# Patient Record
Sex: Male | Born: 1935 | Hispanic: No | Smoking: Former smoker
Health system: Southern US, Community
[De-identification: ages and names within clinical notes are randomized; demographics above are authoritative.]

## PROBLEM LIST (undated history)

## (undated) DIAGNOSIS — K5900 Constipation, unspecified: Secondary | ICD-10-CM

## (undated) DIAGNOSIS — L309 Dermatitis, unspecified: Secondary | ICD-10-CM

## (undated) DIAGNOSIS — N400 Enlarged prostate without lower urinary tract symptoms: Secondary | ICD-10-CM

## (undated) DIAGNOSIS — Z7982 Long term (current) use of aspirin: Secondary | ICD-10-CM

## (undated) DIAGNOSIS — N189 Chronic kidney disease, unspecified: Secondary | ICD-10-CM

## (undated) DIAGNOSIS — M720 Palmar fascial fibromatosis [Dupuytren]: Secondary | ICD-10-CM

## (undated) DIAGNOSIS — I951 Orthostatic hypotension: Secondary | ICD-10-CM

## (undated) DIAGNOSIS — M810 Age-related osteoporosis without current pathological fracture: Secondary | ICD-10-CM

## (undated) DIAGNOSIS — R29898 Other symptoms and signs involving the musculoskeletal system: Secondary | ICD-10-CM

## (undated) DIAGNOSIS — L570 Actinic keratosis: Secondary | ICD-10-CM

## (undated) DIAGNOSIS — R42 Dizziness and giddiness: Secondary | ICD-10-CM

## (undated) DIAGNOSIS — K219 Gastro-esophageal reflux disease without esophagitis: Secondary | ICD-10-CM

## (undated) DIAGNOSIS — Z8719 Personal history of other diseases of the digestive system: Secondary | ICD-10-CM

## (undated) DIAGNOSIS — R269 Unspecified abnormalities of gait and mobility: Secondary | ICD-10-CM

## (undated) DIAGNOSIS — G4700 Insomnia, unspecified: Secondary | ICD-10-CM

## (undated) DIAGNOSIS — R351 Nocturia: Secondary | ICD-10-CM

## (undated) DIAGNOSIS — F419 Anxiety disorder, unspecified: Secondary | ICD-10-CM

## (undated) DIAGNOSIS — K649 Unspecified hemorrhoids: Secondary | ICD-10-CM

## (undated) DIAGNOSIS — N529 Male erectile dysfunction, unspecified: Secondary | ICD-10-CM

## (undated) DIAGNOSIS — H9319 Tinnitus, unspecified ear: Secondary | ICD-10-CM

## (undated) DIAGNOSIS — R600 Localized edema: Secondary | ICD-10-CM

## (undated) HISTORY — PX: EYE SURGERY: SHX253

## (undated) HISTORY — PX: MOUTH SURGERY: SHX715

---

## 1971-08-31 HISTORY — PX: CHOLECYSTECTOMY: SHX55

## 2006-12-02 ENCOUNTER — Encounter: Admission: RE | Admit: 2006-12-02 | Discharge: 2006-12-28 | Payer: Self-pay | Admitting: Neurology

## 2010-12-16 ENCOUNTER — Ambulatory Visit
Admission: RE | Admit: 2010-12-16 | Discharge: 2010-12-16 | Disposition: A | Discharge status: Home / Self Care | Payer: Medicare Other | Source: Ambulatory Visit | Attending: Internal Medicine | Admitting: Internal Medicine

## 2010-12-16 ENCOUNTER — Other Ambulatory Visit: Payer: Self-pay | Admitting: Internal Medicine

## 2010-12-16 DIAGNOSIS — R609 Edema, unspecified: Secondary | ICD-10-CM

## 2012-11-24 IMAGING — US US EXTREM LOW VENOUS*L*
1 series · 14 of 24 positions shown · non-contrast
Comparison: None.

CLINICAL DATA: Pain and swelling left leg.  Rule out DVT

LEFT LOWER EXTREMITY VENOUS DUPLEX ULTRASOUND
TECHNIQUE: Gray-scale sonography with graded compression, as well
as color Doppler and duplex ultrasound were performed to evaluate
the deep venous system of the lower extremity from the level of the
common femoral vein through the popliteal and proximal calf veins.
Spectral Doppler was utilized to evaluate flow at rest and with
distal augmentation maneuvers.

[Series 1: us extrem low venous*left* · 14 of 32 slices shown]
[im 1/32]
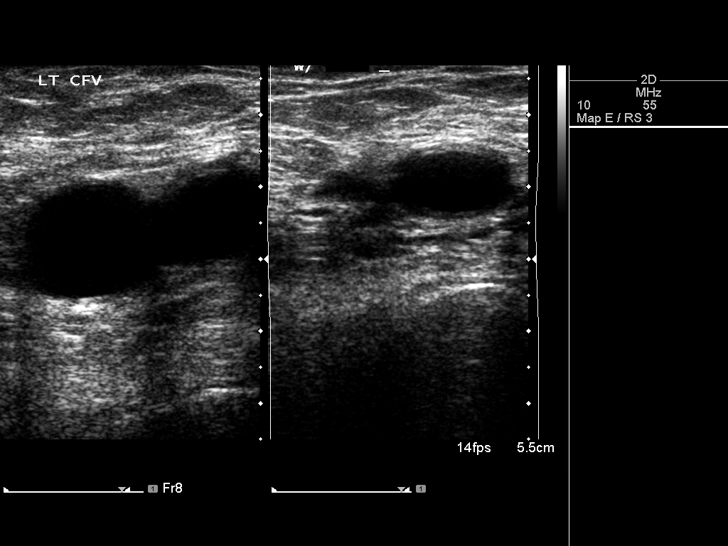
[im 3/32]
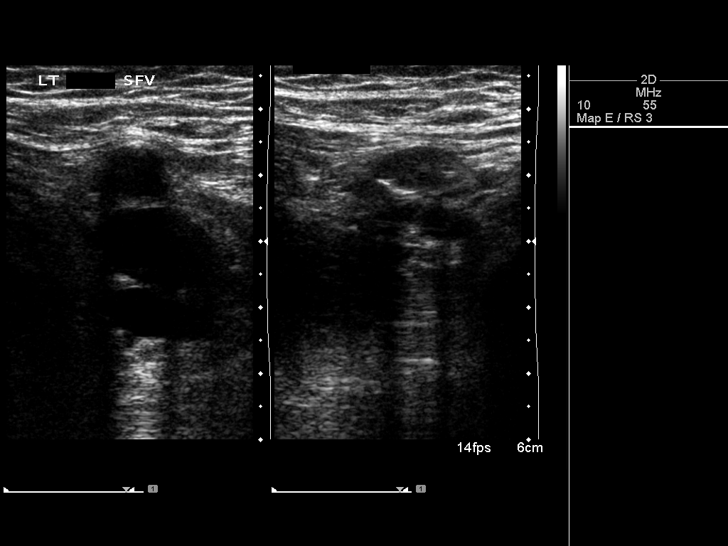
[im 6/32]
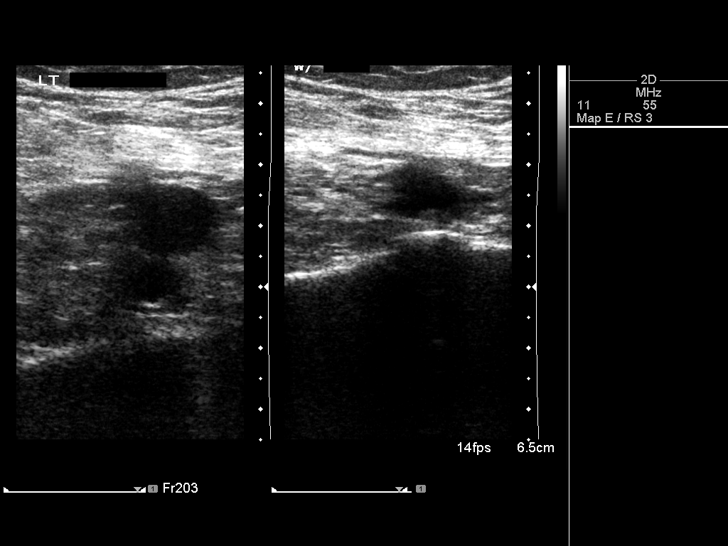
[im 9/32]
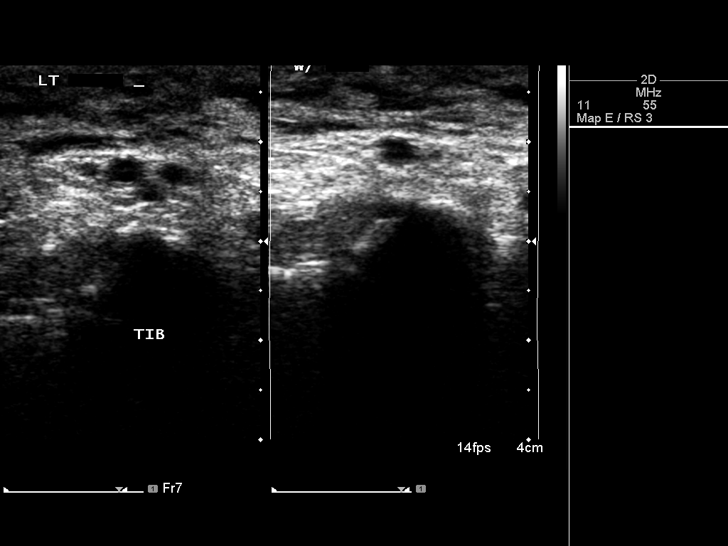
[im 10/32]
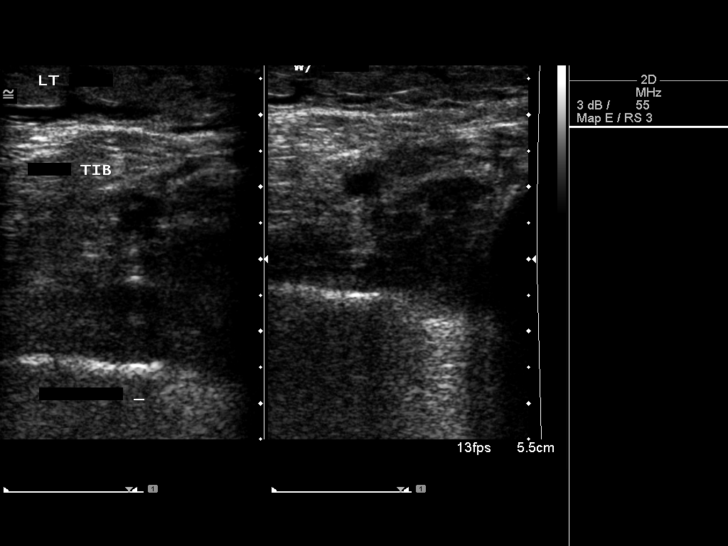
[im 13/32]
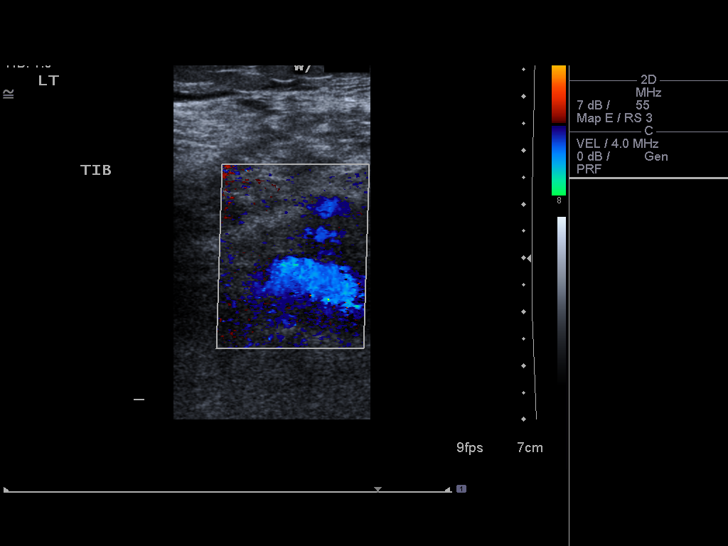
[im 15/32]
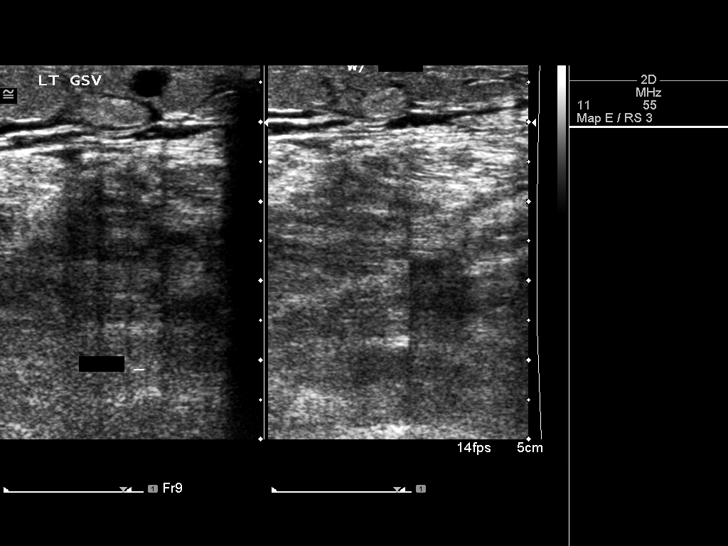
[im 17/32]
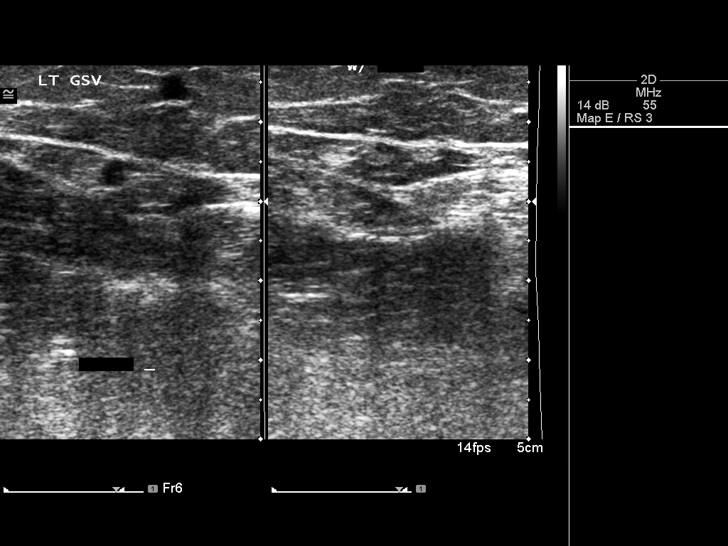
[im 19/32]
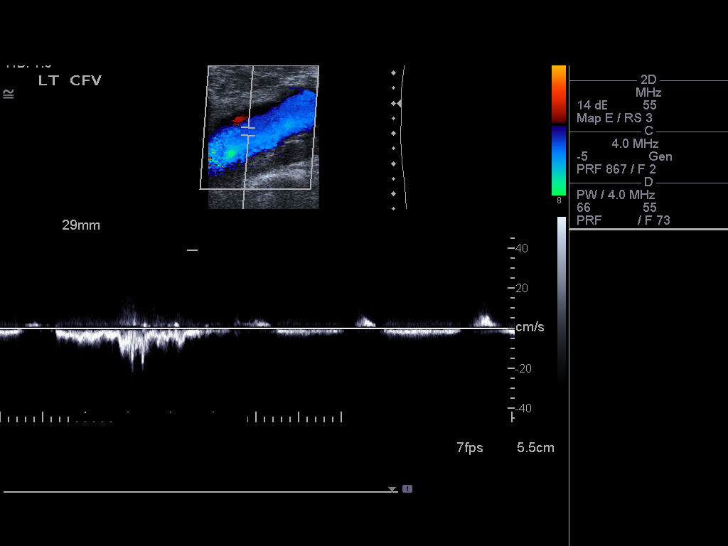
[im 22/32]
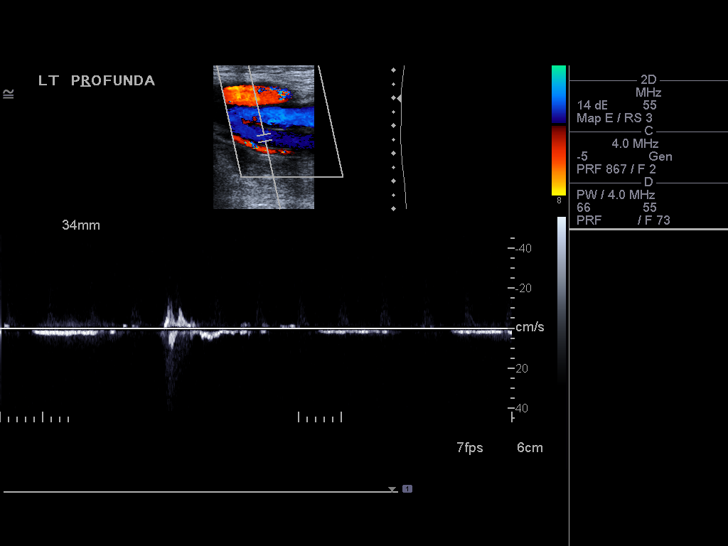
[im 25/32]
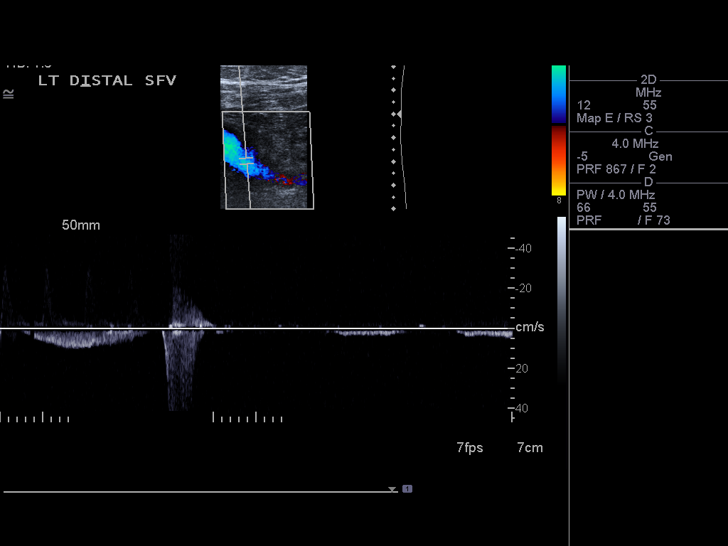
[im 26/32]
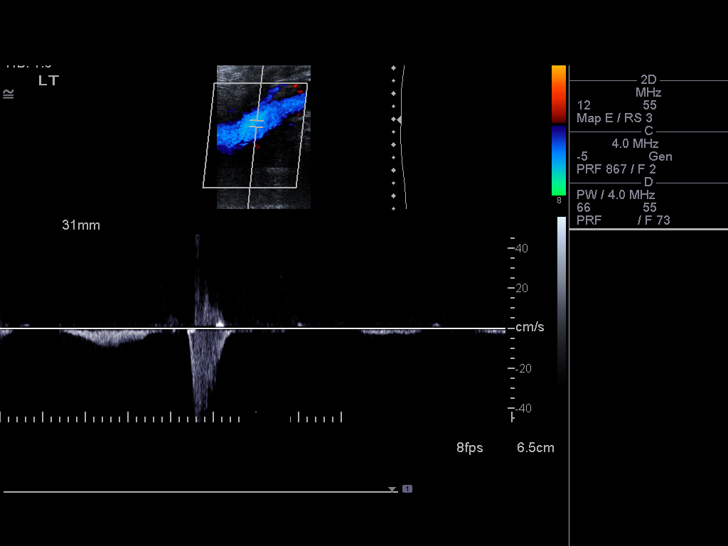
[im 29/32]
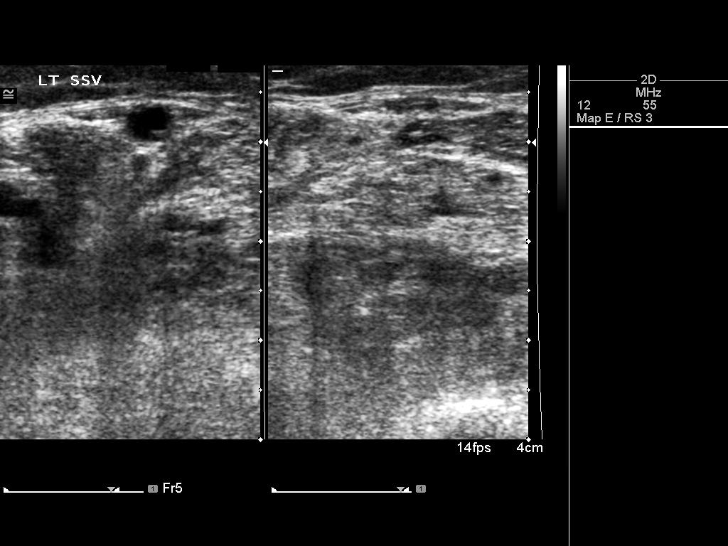
[im 32/32]
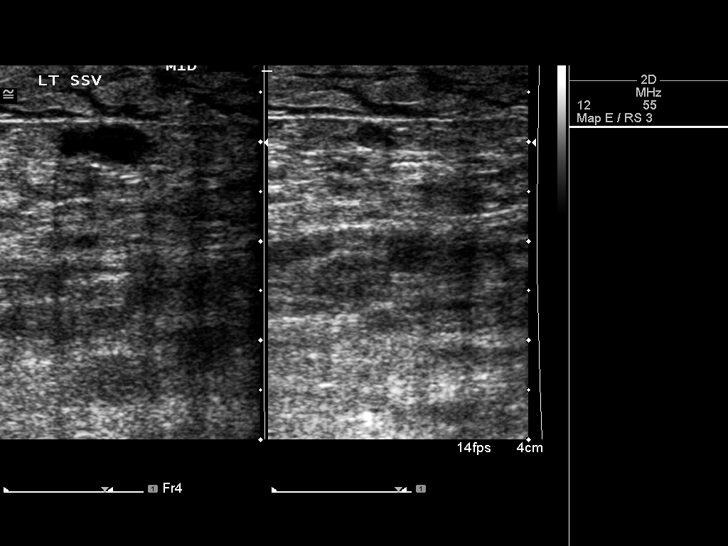

[14 of 24 positions shown; findings below may reference images not displayed]

FINDINGS: Normal compressibility of the common femoral,
superficial femoral, and popliteal veins is demonstrated, as well
as the visualized proximal calf veins.  No filling defects to
suggest DVT on grayscale or color Doppler imaging.  Doppler
waveforms show normal direction of venous flow, normal respiratory
phasicity and response to augmentation.
IMPRESSION: No evidence of lower extremity deep vein thrombosis.

## 2014-11-26 ENCOUNTER — Inpatient Hospital Stay (HOSPITAL_COMMUNITY)
Admission: EM | Admit: 2014-11-26 | Discharge: 2014-12-29 | DRG: 377 | Disposition: E | Payer: Medicare Other | Attending: Internal Medicine | Admitting: Internal Medicine

## 2014-11-26 ENCOUNTER — Encounter (HOSPITAL_COMMUNITY): Payer: Self-pay | Admitting: Emergency Medicine

## 2014-11-26 DIAGNOSIS — Z66 Do not resuscitate: Secondary | ICD-10-CM | POA: Diagnosis present

## 2014-11-26 DIAGNOSIS — I951 Orthostatic hypotension: Secondary | ICD-10-CM | POA: Diagnosis present

## 2014-11-26 DIAGNOSIS — J189 Pneumonia, unspecified organism: Secondary | ICD-10-CM | POA: Diagnosis not present

## 2014-11-26 DIAGNOSIS — K921 Melena: Secondary | ICD-10-CM | POA: Diagnosis present

## 2014-11-26 DIAGNOSIS — T39395A Adverse effect of other nonsteroidal anti-inflammatory drugs [NSAID], initial encounter: Secondary | ICD-10-CM | POA: Diagnosis present

## 2014-11-26 DIAGNOSIS — A419 Sepsis, unspecified organism: Secondary | ICD-10-CM | POA: Diagnosis not present

## 2014-11-26 DIAGNOSIS — Z515 Encounter for palliative care: Secondary | ICD-10-CM | POA: Diagnosis not present

## 2014-11-26 DIAGNOSIS — K922 Gastrointestinal hemorrhage, unspecified: Secondary | ICD-10-CM

## 2014-11-26 DIAGNOSIS — J181 Lobar pneumonia, unspecified organism: Secondary | ICD-10-CM | POA: Diagnosis not present

## 2014-11-26 DIAGNOSIS — Z79899 Other long term (current) drug therapy: Secondary | ICD-10-CM

## 2014-11-26 DIAGNOSIS — D696 Thrombocytopenia, unspecified: Secondary | ICD-10-CM | POA: Diagnosis present

## 2014-11-26 DIAGNOSIS — D638 Anemia in other chronic diseases classified elsewhere: Secondary | ICD-10-CM

## 2014-11-26 DIAGNOSIS — D62 Acute posthemorrhagic anemia: Secondary | ICD-10-CM | POA: Diagnosis present

## 2014-11-26 DIAGNOSIS — M1990 Unspecified osteoarthritis, unspecified site: Secondary | ICD-10-CM | POA: Diagnosis present

## 2014-11-26 DIAGNOSIS — J988 Other specified respiratory disorders: Secondary | ICD-10-CM

## 2014-11-26 HISTORY — DX: Male erectile dysfunction, unspecified: N52.9

## 2014-11-26 HISTORY — DX: Unspecified abnormalities of gait and mobility: R26.9

## 2014-11-26 HISTORY — DX: Long term (current) use of aspirin: Z79.82

## 2014-11-26 HISTORY — DX: Chronic kidney disease, unspecified: N18.9

## 2014-11-26 HISTORY — DX: Other symptoms and signs involving the musculoskeletal system: R29.898

## 2014-11-26 HISTORY — DX: Unspecified hemorrhoids: K64.9

## 2014-11-26 HISTORY — DX: Nocturia: R35.1

## 2014-11-26 HISTORY — DX: Tinnitus, unspecified ear: H93.19

## 2014-11-26 HISTORY — DX: Palmar fascial fibromatosis (dupuytren): M72.0

## 2014-11-26 HISTORY — DX: Benign prostatic hyperplasia without lower urinary tract symptoms: N40.0

## 2014-11-26 HISTORY — DX: Dizziness and giddiness: R42

## 2014-11-26 HISTORY — DX: Age-related osteoporosis without current pathological fracture: M81.0

## 2014-11-26 HISTORY — DX: Insomnia, unspecified: G47.00

## 2014-11-26 HISTORY — DX: Personal history of other diseases of the digestive system: Z87.19

## 2014-11-26 HISTORY — DX: Anxiety disorder, unspecified: F41.9

## 2014-11-26 HISTORY — DX: Gastro-esophageal reflux disease without esophagitis: K21.9

## 2014-11-26 HISTORY — DX: Constipation, unspecified: K59.00

## 2014-11-26 HISTORY — DX: Dermatitis, unspecified: L30.9

## 2014-11-26 HISTORY — DX: Orthostatic hypotension: I95.1

## 2014-11-26 HISTORY — DX: Localized edema: R60.0

## 2014-11-26 HISTORY — DX: Actinic keratosis: L57.0

## 2014-11-26 LAB — BASIC METABOLIC PANEL
ANION GAP: 10 (ref 5–15)
BUN: 61 mg/dL — ABNORMAL HIGH (ref 6–23)
CALCIUM: 8.9 mg/dL (ref 8.4–10.5)
CHLORIDE: 106 mmol/L (ref 96–112)
CO2: 22 mmol/L (ref 19–32)
CREATININE: 1.22 mg/dL (ref 0.50–1.35)
GFR, EST AFRICAN AMERICAN: 54 mL/min — AB (ref >90)
GFR, EST NON AFRICAN AMERICAN: 46 mL/min — AB (ref >90)
GLUCOSE: 95 mg/dL (ref 70–99)
POTASSIUM: 4.8 mmol/L (ref 3.5–5.1)
SODIUM: 138 mmol/L (ref 135–145)

## 2014-11-26 LAB — CBC WITH DIFFERENTIAL/PLATELET
BASOS ABS: 0 10*3/uL (ref 0.0–0.1)
Basophils Relative: 0 % (ref 0–1)
Eosinophils Absolute: 0 10*3/uL (ref 0.0–0.7)
Eosinophils Relative: 0 % (ref 0–5)
HEMATOCRIT: 29.8 % — AB (ref 39.0–52.0)
HEMOGLOBIN: 9.7 g/dL — AB (ref 13.0–17.0)
LYMPHS ABS: 1.2 10*3/uL (ref 0.7–4.0)
Lymphocytes Relative: 15 % (ref 12–46)
MCH: 31 pg (ref 26.0–34.0)
MCHC: 32.6 g/dL (ref 30.0–36.0)
MCV: 95.2 fL (ref 78.0–100.0)
MONOS PCT: 6 % (ref 3–12)
Monocytes Absolute: 0.5 10*3/uL (ref 0.1–1.0)
NEUTROS ABS: 6.2 10*3/uL (ref 1.7–7.7)
Neutrophils Relative %: 79 % — ABNORMAL HIGH (ref 43–77)
PLATELETS: 89 10*3/uL — AB (ref 150–400)
RBC: 3.13 MIL/uL — ABNORMAL LOW (ref 4.22–5.81)
RDW: 14.7 % (ref 11.5–15.5)
WBC: 7.8 10*3/uL (ref 4.0–10.5)

## 2014-11-26 LAB — ABO/RH: ABO/RH(D): A NEG

## 2014-11-26 LAB — CBC
HCT: 29.2 % — ABNORMAL LOW (ref 39.0–52.0)
Hemoglobin: 9.5 g/dL — ABNORMAL LOW (ref 13.0–17.0)
MCH: 30.7 pg (ref 26.0–34.0)
MCHC: 32.5 g/dL (ref 30.0–36.0)
MCV: 94.5 fL (ref 78.0–100.0)
Platelets: 136 10*3/uL — ABNORMAL LOW (ref 150–400)
RBC: 3.09 MIL/uL — AB (ref 4.22–5.81)
RDW: 14.5 % (ref 11.5–15.5)
WBC: 6.8 10*3/uL (ref 4.0–10.5)

## 2014-11-26 LAB — POC OCCULT BLOOD, ED: Fecal Occult Bld: POSITIVE — AB

## 2014-11-26 MED ORDER — ONDANSETRON HCL 4 MG PO TABS: 4.0000 mg | ORAL_TABLET | Freq: Four times a day (QID) | ORAL | Status: DC | PRN

## 2014-11-26 MED ORDER — ACETAMINOPHEN 650 MG RE SUPP
650.0000 mg | Freq: Four times a day (QID) | RECTAL | Status: DC | PRN
  Administered 2014-11-29: 650 mg via RECTAL
  Filled 2014-11-26: qty 1

## 2014-11-26 MED ORDER — ACETAMINOPHEN 325 MG PO TABS
650.0000 mg | ORAL_TABLET | Freq: Four times a day (QID) | ORAL | Status: DC | PRN
  Administered 2014-11-27: 975 mg via ORAL
  Administered 2014-11-27: 650 mg via ORAL
  Administered 2014-11-28: 975 mg via ORAL
  Filled 2014-11-26: qty 2
  Filled 2014-11-26 (×2): qty 3

## 2014-11-26 MED ORDER — SODIUM CHLORIDE 0.9 % IV SOLN: 250.0000 mL | INTRAVENOUS | Status: DC | PRN

## 2014-11-26 MED ORDER — MIRTAZAPINE 15 MG PO TABS
15.0000 mg | ORAL_TABLET | Freq: Every evening | ORAL | Status: DC | PRN
  Administered 2014-11-26 – 2014-11-29 (×3): 15 mg via ORAL
  Filled 2014-11-26 (×6): qty 1

## 2014-11-26 MED ORDER — SODIUM CHLORIDE 0.9 % IJ SOLN
3.0000 mL | Freq: Two times a day (BID) | INTRAMUSCULAR | Status: DC
  Administered 2014-11-26: 3 mL via INTRAVENOUS

## 2014-11-26 MED ORDER — SODIUM CHLORIDE 0.9 % IV SOLN
INTRAVENOUS | Status: DC
  Administered 2014-11-26 – 2014-11-28 (×4): via INTRAVENOUS

## 2014-11-26 MED ORDER — PANTOPRAZOLE SODIUM 40 MG IV SOLR
40.0000 mg | Freq: Once | INTRAVENOUS | Status: AC
  Administered 2014-11-26: 40 mg via INTRAVENOUS
  Filled 2014-11-26: qty 40

## 2014-11-26 MED ORDER — ALUM & MAG HYDROXIDE-SIMETH 200-200-20 MG/5ML PO SUSP: 30.0000 mL | Freq: Four times a day (QID) | ORAL | Status: DC | PRN

## 2014-11-26 MED ORDER — SODIUM CHLORIDE 0.9 % IV BOLUS (SEPSIS)
1000.0000 mL | Freq: Once | INTRAVENOUS | Status: AC
  Administered 2014-11-26: 1000 mL via INTRAVENOUS

## 2014-11-26 MED ORDER — ONDANSETRON HCL 4 MG/2ML IJ SOLN: 4.0000 mg | Freq: Four times a day (QID) | INTRAMUSCULAR | Status: DC | PRN

## 2014-11-26 MED ORDER — SODIUM CHLORIDE 0.9 % IJ SOLN: 3.0000 mL | INTRAMUSCULAR | Status: DC | PRN

## 2014-11-26 NOTE — Progress Notes (Signed)
pcp is Radio producerwalter pharr EPIC updated

## 2014-11-26 NOTE — ED Provider Notes (Signed)
CSN: [id number]     Arrival date & time 11/23/2014  1156 History   First MD Initiated Contact with Patient 11/10/2014 1259     Chief Complaint  Patient presents with  . Rectal Bleeding    Patient is a [age] y.o. male presenting with hematochezia. The history is provided by the patient, a caregiver and a relative.  Rectal Bleeding Quality:  Black and tarry Amount:  Moderate Duration:  3 days Timing:  Intermittent Progression:  Worsening Chronicity:  New Relieved by:  Nothing Worsened by:  Nothing tried Associated symptoms: no abdominal pain, no fever and no loss of consciousness   Risk factors: no anticoagulant use   pt reported dark stool about 3 days ago to family He has been taking ibuprofen recently He reports he feels weak He denies any abdominal pain No other complaints at this time   Past Medical History  Diagnosis Date  . Anxiety    No past surgical history on file. No family history on file. History  Substance Use Topics  . Smoking status: Never Smoker   . Smokeless tobacco: Not on file  . Alcohol Use: No    Review of Systems  Constitutional: Negative for fever.  Cardiovascular: Negative for chest pain.  Gastrointestinal: Positive for hematochezia. Negative for abdominal pain.  Neurological: Positive for weakness. Negative for loss of consciousness.  All other systems reviewed and are negative.     Allergies  Review of patient's allergies indicates no known allergies.  Home Medications   Prior to Admission medications   Medication Sig Start Date End Date Taking? Authorizing Provider  mirtazapine (REMERON) 15 MG tablet Take 15 mg by mouth every evening.   Yes Historical Provider, MD  Suvorexant 20 MG TABS Take 0.5-1 tablets by mouth daily as needed (for sleep).    Yes Historical Provider, MD   BP 138/73 mmHg  Pulse 84  Temp(Src) 97.6 F (36.4 C) (Oral)  Resp 15  SpO2 98% Physical Exam CONSTITUTIONAL: elderly, fraily HEAD:  Normocephalic/atraumatic EYES: EOMI ENMT: Mucous membranes moist, poor dentition NECK: supple no meningeal signs SPINE/BACK:entire spine nontender CV: S1/S2 noted, no murmurs/rubs/gallops noted LUNGS: Lungs are clear to auscultation bilaterally, no apparent distress ABDOMEN: soft, nontender, no rebound or guarding, bowel sounds noted throughout abdomen Rectal - black stool noted.  Hemoccult positive. Nurse chaperone at bedside NEURO: Pt is awake/alert/appropriate, moves all extremitiesx4.   EXTREMITIES: pulses normal/equal, full ROM SKIN: warm, color normal   ED Course  Procedures   3:21 PM Pt with GI bleed He is stable at this time Per daughters who are POA - pt is DNR but would accept blood products if necessary D/w dr Butler Denmarkrizwan will admit.  Will await orthostatics to determine if patient requires stepdown Labs Review Labs Reviewed  BASIC METABOLIC PANEL - Abnormal; Notable for the following:    BUN 61 (*)    GFR calc non Af Amer 46 (*)    GFR calc Af Amer 54 (*)    All other components within normal limits  CBC WITH DIFFERENTIAL/PLATELET - Abnormal; Notable for the following:    RBC 3.13 (*)    Hemoglobin 9.7 (*)    HCT 29.8 (*)    Platelets 89 (*)    Neutrophils Relative % 79 (*)    All other components within normal limits  POC OCCULT BLOOD, ED - Abnormal; Notable for the following:    Fecal Occult Bld POSITIVE (*)    All other components within normal limits  TYPE AND  SCREEN  ABO/RH     MDM   Final diagnoses:  Gastrointestinal hemorrhage, unspecified gastritis, unspecified gastrointestinal hemorrhage type    Nursing notes including past medical history and social history reviewed and considered in documentation Labs/vital reviewed myself and considered during evaluation     Zadie Rhine, MD 11/15/2014 [phone]

## 2014-11-26 NOTE — Plan of Care (Signed)
This NP called by Dr. Toniann FailKakrakandy of [facility]. Pt originally slated for SDU bed. However, there are no SDU beds available and pt is a DNR and family wishes no procedures or aggressive measures. Therefore, per Dr. Toniann FailKakrakandy, pt may be admitted to tele bed. Order placed. Informed ED.  Darren NormanKaren Kirby-Graham, NP [facility]

## 2014-11-26 NOTE — Progress Notes (Addendum)
81 [place] living with Daughter, Andrey CampanileSandy c/o black stools dx melena, GI bleed.   CM consult family interested in home health ED CM spoke with pt and 2 daughters (Peggy & Andrey CampanileSandy- retired Engineer, maintenanceschool teachers, Clinical cytogeneticisteggy [place]  Report previous attempt with home health [facility] Now has home instead staff "Nate" working with pt only CM reviewed in details medicare guidelines, home health (HH) (length of stay in home, [facility], coverage, primary caregiver, up to 24 hrs before services may be started), Private duty nursing (PDN-coverage, length of stay in the home types of staff available), assisted living (ASL- coverage, services offered) and Skilled nursing facilities (snf- coverage and services offered)  CM reviewed availability of HH SW to assist pcp to get pt to snf (if desired disposition) from the community level. CM provided both daughters with a list of [place] home health agencies & PDN. Answered all their questions. Daughters are still un decided if services will be needed at discharge Informed them CM would enter orders and if needed they can access or later if pt d/c his pcp can attempt to start services again Pt and daughters voiced understanding, appreciate the offer and agreed to offer of initiating home health orders.

## 2014-11-26 NOTE — ED Notes (Signed)
I tried to get blood but was unsuccessful 

## 2014-11-26 NOTE — H&P (Addendum)
[facility] History and Physical  Darren Pollard YQM:[id number] DOB: [birth year] DOA: 2014/12/08   PCP: Londell Moh, MD    Chief Complaint: black stools  HPI: Darren Pollard is a 81 y.o. male with arthritis and insomnia who presents with black stools for 3 days. He had 2 episodes this morning he has not been lightheaded and has not had any abdominal pain. He did not have any nausea or vomiting or weight loss. He has been taking ibuprofen just started in February for his arthritis. He's been taking it once or twice a day. And at times he's been taking aspirin. He is not taking Kaopectate.  He's found to be Hemoccult positive. He does not want an EGD if bleeding was to become severe. His daughters are at his bedside and they are agreement with this.   General: The patient denies anorexia, fever, weight loss- he has had generalized weakness for the past couple of days and had a fall 2 days ago according to his daughter. He is otherwise not prone to falling. Cardiac: Denies chest pain, syncope, palpitations, pedal edema  Respiratory: Denies cough, shortness of breath, wheezing GI: Denies severe indigestion/heartburn, abdominal pain, nausea, vomiting, diarrhea and constipation GU: Denies hematuria, incontinence, dysuria  Musculoskeletal: Other than arthritis pain, and no other severe pain Skin: Denies suspicious skin lesions Neurologic: Denies focal weakness or numbness, change in vision Psychiatry: Denies depression or anxiety. Has insomnia Hematologic: +easy bruising or bleeding   Past Medical History  Diagnosis Date  . Anxiety     No past surgical history on file.  Social History: does not smoke or drink alcohol Lives at home with daughter- walks with a cane    No Known Allergies  Family history - no medical issues in his family   Prior to Admission medications   Medication Sig Start Date End Date Taking? Authorizing Provider  mirtazapine (REMERON) 15 MG tablet  Take 15 mg by mouth every evening.   Yes Historical Provider, MD  Suvorexant 20 MG TABS Take 0.5-1 tablets by mouth daily as needed (for sleep).    Yes Historical Provider, MD     Physical Exam: Filed Vitals:   12/08/14 1430 12/08/2014 1445 12/08/2014 1451 12-08-14 1453  BP: 136/82 147/79 138/73   Pulse: 86 86 87 84  Temp:      TempSrc:      Resp: SpO2: 98% 98%  98%     General: Awake alert oriented 3, very hard of hearing HEENT: Normocephalic and Atraumatic, Mucous membranes pink                PERRLA; EOM intact; No scleral icterus,                 Nares: Patent, Oropharynx: Clear, Fair Dentition                 Neck: FROM, no cervical lymphadenopathy, thyromegaly, carotid bruit or JVD;  Breasts: deferred CHEST WALL: No tenderness  CHEST: Normal respiration, clear to auscultation bilaterally  HEART: Regular rate and rhythm; no murmurs rubs or gallops  BACK: No kyphosis or scoliosis; no CVA tenderness  GI: Positive Bowel Sounds, soft, non-tender; no masses, no organomegaly Rectal Exam: deferred MSK: No cyanosis, clubbing, + pitting edema in bilateral legs Genitalia: not examined  SKIN:  no rash or ulceration  CNS: Alert and Oriented x 4, Nonfocal exam, CN 2-12 intact  Labs on Admission:  Basic Metabolic Panel:  Recent Labs Lab 12-08-14 1327  NA 138  K 4.8  CL 106  CO2 22  GLUCOSE 95  BUN 61*  CREATININE 1.22  CALCIUM 8.9   Liver Function Tests: No results for input(s): AST, ALT, ALKPHOS, BILITOT, PROT, ALBUMIN in the last 168 hours. No results for input(s): LIPASE, AMYLASE in the last 168 hours. No results for input(s): AMMONIA in the last 168 hours. CBC:  Recent Labs Lab 02-03-15 1327  WBC 7.8  NEUTROABS 6.2  HGB 9.7*  HCT 29.8*  MCV 95.2  PLT 89*   Cardiac Enzymes: No results for input(s): CKTOTAL, CKMB, CKMBINDEX, TROPONINI in the last 168 hours.  BNP (last 3 results) No results for input(s): BNP in the last 8760 hours.  ProBNP  (last 3 results) No results for input(s): PROBNP in the last 8760 hours.  CBG: No results for input(s): GLUCAP in the last 168 hours.  Radiological Exams on Admission: No results found.    Assessment/Plan Active Problems: GI bleed/melena - Hold NSAIDs as this may be the likely cause -Start IV Protonix twice a day-make him nothing by mouth for at least 24 hours-transfuse as needed - no desire for procedures by family or patient -for this reason I have not called a GI consult  Anemia - no other labs to compare with - follow H and H Q 12  Orthostatic hypotension - will give 1 L NS and re-check- his BP dropped on standing but he did not feel dizzy and HR did not go up - continue IVF while NPO  Thrombocytopenia - again, no old labs - follow - avoid anticoagulants- SCDS for DVT prophylaxis  ARthritis - Try Tylenol instead for now- Mayby Ultram at home?  Insomia - resume home med      Consulted:   Code Status: DNR Family Communication: daughters at bedside  DVT Prophylaxis:SCDs  Time spent: 50 min  Teriah Muela, MD [facility]  If 7PM-7AM, please contact night-coverage [email or web address] 06-29-2015, 3:27 PM

## 2014-11-26 NOTE — ED Notes (Signed)
Bed: [id number] Expected date:  Expected time:  Means of arrival:  Comments: EMS-bloody stools

## 2014-11-26 NOTE — Progress Notes (Signed)
At approximately 3:45 pm admission history was completed after Dr. Butler Denmarkizwan spoke with pt's 2 daughters.Allergies and health history were updated as no allergies were noted in history and no health problems were noted except anxiety.No surgeries were noted.  Asked pharmacy tech to go and speak with daughter in room as she has list of patient's medications that are not complete in epic system. Also left sticky note under treatment team sticky notes for pt's rn regarding  pt's right elbow. Daughter states he had an area removed by dermatologist 3 weeks ago and it is not healing. She states pt puts his weight on right elbow to push up in bed.   Darren Pollard. Darren Pollard BSN, RN-BC Admissions RN  11/27/2014 4:56 PM

## 2014-11-26 NOTE — ED Notes (Signed)
Per EMS-dark red stool this am X 2-something new, asymptomatic, no diarrhea, no abdominal pain

## 2014-11-27 DIAGNOSIS — D62 Acute posthemorrhagic anemia: Secondary | ICD-10-CM

## 2014-11-27 DIAGNOSIS — M1990 Unspecified osteoarthritis, unspecified site: Secondary | ICD-10-CM

## 2014-11-27 DIAGNOSIS — K922 Gastrointestinal hemorrhage, unspecified: Secondary | ICD-10-CM

## 2014-11-27 DIAGNOSIS — T39395A Adverse effect of other nonsteroidal anti-inflammatory drugs [NSAID], initial encounter: Secondary | ICD-10-CM

## 2014-11-27 LAB — CBC
HCT: 27.8 % — ABNORMAL LOW (ref 39.0–52.0)
HCT: 28.3 % — ABNORMAL LOW (ref 39.0–52.0)
Hemoglobin: 9.1 g/dL — ABNORMAL LOW (ref 13.0–17.0)
Hemoglobin: 9.2 g/dL — ABNORMAL LOW (ref 13.0–17.0)
MCH: 31 pg (ref 26.0–34.0)
MCH: 31.2 pg (ref 26.0–34.0)
MCHC: 32.7 g/dL (ref 30.0–36.0)
MCHC: 32.5 g/dL (ref 30.0–36.0)
MCV: 94.6 fL (ref 78.0–100.0)
MCV: 95.9 fL (ref 78.0–100.0)
Platelets: 131 10*3/uL — ABNORMAL LOW (ref 150–400)
Platelets: 140 10*3/uL — ABNORMAL LOW (ref 150–400)
RBC: 2.94 MIL/uL — AB (ref 4.22–5.81)
RBC: 2.95 MIL/uL — AB (ref 4.22–5.81)
RDW: 14.6 % (ref 11.5–15.5)
RDW: 14.7 % (ref 11.5–15.5)
WBC: 6.2 10*3/uL (ref 4.0–10.5)
WBC: 5.3 10*3/uL (ref 4.0–10.5)

## 2014-11-27 LAB — BASIC METABOLIC PANEL
Anion gap: 5 (ref 5–15)
BUN: 48 mg/dL — ABNORMAL HIGH (ref 6–23)
CO2: 24 mmol/L (ref 19–32)
Calcium: 8.5 mg/dL (ref 8.4–10.5)
Chloride: 109 mmol/L (ref 96–112)
Creatinine, Ser: 1.04 mg/dL (ref 0.50–1.35)
GFR calc non Af Amer: 56 mL/min — ABNORMAL LOW (ref >90)
GFR, EST AFRICAN AMERICAN: 65 mL/min — AB (ref >90)
Glucose, Bld: 92 mg/dL (ref 70–99)
Potassium: 4.1 mmol/L (ref 3.5–5.1)
Sodium: 138 mmol/L (ref 135–145)

## 2014-11-27 MED ORDER — PANTOPRAZOLE SODIUM 40 MG IV SOLR
40.0000 mg | Freq: Every day | INTRAVENOUS | Status: DC
  Administered 2014-11-27 – 2014-11-29 (×3): 40 mg via INTRAVENOUS
  Filled 2014-11-27 (×4): qty 40

## 2014-11-27 NOTE — Progress Notes (Addendum)
Patient ID: Darren Pollard, male   DOB: 1911/06/29, [age] y.o.   MRN: [id number] [facility] PROGRESS NOTE  Darren MooresHenry Pollard WUJ:[id number] DOB: 1911/06/29 DOA: September 14, 2014 PCP: Londell MohPHARR,WALTER DAVIDSON, MD  Brief narrative:    [age] y.o. male with past medical history of arthritis, insomnia who presented with black stools for last 3 days prior to this admission. Patient did not report associated abdominal pain, nausea or vomiting. No associated weight loss. He has been taking ibuprofen for arthritis once or twice daily and at times he's taking aspirin. On admission, patient was hemodynamically stable he was found to have Hemoccult-positive stool. Patient does not want endoscopies or other similar interventions. He was admitted to monitor for GI bleed.  Assessment/Plan:    Principal problem: Upper GI bleed secondary to NSAIDS / acute blood loss anemia -Patient presented with black stools for past few days prior to this admission. He was on ibuprofen for arthritic pain. He likely has upper GI bleed. - His hemoglobin has been stable since the admission, ranging 9.12 9.7.  - I will add Protonix 40 mg IV daily. - Continue supportive care with IV fluids, reduce rate to 50 mL an hour  Active Problems: Thrombocytopenia - Unclear etiology. Platelet count is much better since the admission, it trended up from 89-131. - Using SCDs for DVT prophylaxis.  Arthritis - Avoid NSAIDs. PT evaluation   DVT Prophylaxis  - SCDs bilaterally   Code Status: DNR/DNI Family Communication:  Family not at the bedside this morning.  Disposition Plan:Needs physical therapy evaluation    IV access:  Peripheral IV  Procedures and diagnostic studies:    No results found.  Medical Consultants:  None   Other Consultants:  Physical therapy   IAnti-Infectives:   None   Manson PasseyEVINE, Verlene Glantz, MD  [facility] Pager [phone]  If 7PM-7AM, please contact night-coverage [email or web address] Password [email or web address] 11/27/2014,  10:16 AM   LOS: 1 day    HPI/Subjective: No acute overnight events.  Objective: Filed Vitals:   01/18/15 1930 01/18/15 1945 01/18/15 2000 01/18/15 2057  BP: 148/86 152/76 129/84   Pulse: 78 72 82   Temp:    97.9 F (36.6 C)  TempSrc:    Axillary  Resp: 17 14 15 19   Height:    5' 0.8" (1.544 m)  Weight:    65.8 kg (145 lb 1 oz)  SpO2: 97% 100% 98% 100%    Intake/Output Summary (Last 24 hours) at 11/27/14 1016 Last data filed at 11/27/14 0900  Gross per 24 hour  Intake 868.75 ml  Output    600 ml  Net 268.75 ml    Exam:   General:  Pt isnot in acute distress   Cardiovascular: Regular rate and rhythm, S1/S2 appreciated   Respiratory: Clear to auscultation bilaterally, no wheezing, no crackles, no rhonchi  Abdomen: Soft, non tender, non distended, bowel sounds present  Extremities: No edema, pitting edema appreciated   Neuro: Grossly nonfocal  Data Reviewed: Basic Metabolic Panel:  Recent Labs Lab 01/18/15 1327 11/27/14 0419  NA 138 138  K 4.8 4.1  CL 106 109  CO2 22 24  GLUCOSE 95 92  BUN 61* 48*  CREATININE 1.22 1.04  CALCIUM 8.9 8.5   Liver Function Tests: No results for input(s): AST, ALT, ALKPHOS, BILITOT, PROT, ALBUMIN in the last 168 hours. No results for input(s): LIPASE, AMYLASE in the last 168 hours. No results for input(s): AMMONIA in the last 168 hours. CBC:  Recent Labs Lab 01/18/15 1327 01/18/15 1641 11/27/14  0419  WBC 7.8 6.8 6.2  NEUTROABS 6.2  --   --   HGB 9.7* 9.5* 9.1*  HCT 29.8* 29.2* 27.8*  MCV 95.2 94.5 94.6  PLT 89* 136* 131*   Cardiac Enzymes: No results for input(s): CKTOTAL, CKMB, CKMBINDEX, TROPONINI in the last 168 hours. BNP: Invalid input(s): POCBNP CBG: No results for input(s): GLUCAP in the last 168 hours.  No results found for this or any previous visit (from the past 240 hour(s)).   Scheduled Meds: . sodium chloride  3 mL Intravenous Q12H   Continuous Infusions: . sodium chloride 125 mL/hr at  11/27/14 0636

## 2014-11-28 LAB — CBC
HCT: 26.4 % — ABNORMAL LOW (ref 39.0–52.0)
HEMATOCRIT: 29.7 % — AB (ref 39.0–52.0)
HEMOGLOBIN: 9.6 g/dL — AB (ref 13.0–17.0)
Hemoglobin: 8.6 g/dL — ABNORMAL LOW (ref 13.0–17.0)
MCH: 30.7 pg (ref 26.0–34.0)
MCH: 30.7 pg (ref 26.0–34.0)
MCHC: 32.6 g/dL (ref 30.0–36.0)
MCHC: 32.3 g/dL (ref 30.0–36.0)
MCV: 94.3 fL (ref 78.0–100.0)
MCV: 94.9 fL (ref 78.0–100.0)
PLATELETS: 130 10*3/uL — AB (ref 150–400)
Platelets: 150 10*3/uL (ref 150–400)
RBC: 2.8 MIL/uL — ABNORMAL LOW (ref 4.22–5.81)
RBC: 3.13 MIL/uL — ABNORMAL LOW (ref 4.22–5.81)
RDW: 14.5 % (ref 11.5–15.5)
RDW: 14.8 % (ref 11.5–15.5)
WBC: 6.4 10*3/uL (ref 4.0–10.5)
WBC: 10.4 10*3/uL (ref 4.0–10.5)

## 2014-11-28 MED ORDER — SUVOREXANT 10 MG PO TABS: 0.5000 | ORAL_TABLET | Freq: Every day | ORAL | Status: DC

## 2014-11-28 MED ORDER — TRAMADOL HCL 50 MG PO TABS
50.0000 mg | ORAL_TABLET | Freq: Four times a day (QID) | ORAL | Status: AC | PRN
  Administered 2014-11-28 (×2): 50 mg via ORAL
  Filled 2014-11-28 (×2): qty 1

## 2014-11-28 MED ORDER — ZOLPIDEM TARTRATE 5 MG PO TABS
5.0000 mg | ORAL_TABLET | Freq: Every day | ORAL | Status: DC
  Administered 2014-11-28 – 2014-11-29 (×2): 5 mg via ORAL
  Filled 2014-11-28 (×2): qty 1

## 2014-11-28 MED ORDER — MIRTAZAPINE 15 MG PO TABS
15.0000 mg | ORAL_TABLET | Freq: Every evening | ORAL | Status: DC
  Administered 2014-11-28 – 2014-11-29 (×2): 15 mg via ORAL
  Filled 2014-11-28 (×3): qty 1

## 2014-11-28 NOTE — Progress Notes (Signed)
   11/28/14 [id number]  What Happened  Was fall witnessed? No  Was patient injured? Yes  Patient found on floor  Found by Staff-comment (Catie Tildon HuskyBeeson RN)  Stated prior activity bathroom-unassisted  Follow Up  MD notified Dr. Elisabeth Pigeonevine  Time MD notified [phone]  Family notified Yes-comment (Daughter)  Time family notified 0825  Additional tests No  Simple treatment Dressing  Progress note created (see row info) Yes  Adult Fall Risk Assessment  Risk Factor Category (scoring not indicated) Fall has occurred during this admission (document High fall risk)  Patient's Fall Risk High Fall Risk (>13 points)  Adult Fall Risk Interventions  Required Bundle Interventions *See Row Information* High fall risk - low, moderate, and high requirements implemented  Additional Interventions Fall risk signage;Individualized elimination schedule;Room near nurses station;Secure all tubes/drains;Use of appropriate toileting equipment (bedpan, BSC, etc.)  Vitals  BP 139/86 mmHg  BP Location Left Arm  BP Method Automatic  Patient Position (if appropriate) Sitting  Pulse Rate 87  Pulse Rate Source Dinamap  Resp 18  Oxygen Therapy  SpO2 95 %  O2 Device Room Air  Pain Assessment  Pain Assessment PAINAD  PAINAD (Pain Assessment in Advanced Dementia)  Breathing 0  Negative Vocalization 0  Facial Expression 0  Body Language 0  Consolability 0  PAINAD Score 0  PCA/Epidural/Spinal Assessment  Respiratory Pattern Regular;Unlabored  Neurological  Neuro (WDL) X  Level of Consciousness Alert  Orientation Level Oriented to person;Disoriented to time;Disoriented to situation;Disoriented to place  Cognition Follows commands;Poor safety awareness;Memory impairment;Other (Comment) (confused at times)  Speech Appropriate for developmental age  Musculoskeletal  Musculoskeletal (WDL) X  Generalized Weakness Yes  Weight Bearing Restrictions No  Integumentary  Integumentary (WDL) X (left leg with redness)  Skin  Color Red (left leg)  Skin Condition Dry  Skin Integrity Abrasion  Abrasion Location Foot  Abrasion Location Orientation Left  Abrasion Intervention Gauze

## 2014-11-28 NOTE — Progress Notes (Signed)
Patient ID: Darren Pollard, male   DOB: 10-Feb-1911, 81 y.o.   MRN: [id number] [facility] PROGRESS NOTE  Robt Okuda WUJ:[id number] DOB: [birth year] DOA: 12/11/2014 PCP: Londell Moh, MD  Brief narrative:    81 y.o. male with past medical history of arthritis, insomnia who presented with black stools for last 3 days prior to this admission. Patient did not report associated abdominal pain, nausea or vomiting. No associated weight loss. He has been taking ibuprofen for arthritis once or twice daily and at times he's taking aspirin. On admission, patient was hemodynamically stable he was found to have Hemoccult-positive stool. Patient does not want endoscopies or other similar interventions. He was admitted to monitor for GI bleed.  Assessment/Plan:    Principal problem: Upper GI bleed secondary to NSAIDS / acute blood loss anemia -Patient presented with black stools for past few days prior to this admission. He was on ibuprofen for arthritic pain.  - No episodes of bleeding since the admission. - Hemoglobin is 8.6 this morning. Continue to monitor CBC. - Continue IV Protonix daily - Continue IV fluids for supportive care.  Active Problems: Thrombocytopenia - Unclear etiology. Platelet count 130 this morning. - Using SCDs for DVT prophylaxis.  Arthritis - Avoid NSAIDs. PT evaluation pending   DVT Prophylaxis  - SCDs bilaterally   Code Status: DNR/DNI Family Communication:  Updated family at the bedside. Disposition Plan: Family interested in getting patient to short-term rehabilitation. Physical therapy evaluation pending   IV access:  Peripheral IV  Procedures and diagnostic studies:    No results found.  Medical Consultants:  None   Other Consultants:  Physical therapy   IAnti-Infectives:   None   Manson Passey, MD  [facility] Pager [phone]  If 7PM-7AM, please contact night-coverage [email or web address] Password [facility] 11/28/2014, 10:35 AM   LOS: 2 days    HPI/Subjective: No acute overnight events.  Objective: Filed Vitals:   11/27/14 1320 11/27/14 2047 11/28/14 0513 11/28/14 0822  BP: 138/87  142/83 139/86  Pulse: 81  84 87  Temp: 97.8 F (36.6 C) 98 F (36.7 C) 97.9 F (36.6 C)   TempSrc: Oral Oral Oral   Resp: Height:      Weight:      SpO2: 100% 100% 95% 95%    Intake/Output Summary (Last 24 hours) at 11/28/14 1035 Last data filed at 11/28/14 0453  Gross per 24 hour  Intake    120 ml  Output   1300 ml  Net  -1180 ml    Exam:   General:  Pt is awake, no distress  Cardiovascular: Rate control, appreciate S1, S2  Respiratory: No wheezing, no crackles  Abdomen: Nontender, nondistended abdomen, appreciate bowel sounds  Extremities: Pitting edema appreciated, pulses palpable  Neuro: No focal deficits  Data Reviewed: Basic Metabolic Panel:  Recent Labs Lab December 11, 2014 1327 11/27/14 0419  NA 138 138  K 4.8 4.1  CL 106 109  CO2 22 24  GLUCOSE 95 92  BUN 61* 48*  CREATININE 1.22 1.04  CALCIUM 8.9 8.5   Liver Function Tests: No results for input(s): AST, ALT, ALKPHOS, BILITOT, PROT, ALBUMIN in the last 168 hours. No results for input(s): LIPASE, AMYLASE in the last 168 hours. No results for input(s): AMMONIA in the last 168 hours. CBC:  Recent Labs Lab 12/11/14 1327 12/11/2014 1641 11/27/14 0419 11/27/14 1605 11/28/14 0340  WBC 7.8 6.8 6.2 5.3 6.4  NEUTROABS 6.2  --   --   --   --  HGB 9.7* 9.5* 9.1* 9.2* 8.6*  HCT 29.8* 29.2* 27.8* 28.3* 26.4*  MCV 95.2 94.5 94.6 95.9 94.3  PLT 89* 136* 131* 140* 130*   Cardiac Enzymes: No results for input(s): CKTOTAL, CKMB, CKMBINDEX, TROPONINI in the last 168 hours. BNP: Invalid input(s): POCBNP CBG: No results for input(s): GLUCAP in the last 168 hours.  No results found for this or any previous visit (from the past 240 hour(s)).   Scheduled Meds: . pantoprazole (PROTONIX) IV  40 mg Intravenous Daily   Continuous  Infusions: . sodium chloride 50 mL/hr at 11/28/14 [phone]

## 2014-11-28 NOTE — Progress Notes (Signed)
[facility] ED CM received a call from Pegy x 2 to inquired about home health.  CM returned a call to her to inform her that [facility] would be assisting with pt.  Gigi Gineggy states they have decided to let pt go to [facility] for short term rehab [facility] Unit CM updated

## 2014-11-28 NOTE — Evaluation (Signed)
Occupational Therapy Evaluation Patient Details Name: Darren MooresHenry Grammatico MRN: [id number] DOB: [birth year] Today's Date: 11/28/2014    History of Present Illness [age] y.o. male with arthritis and insomnia who presents with black stools for 3 days   Clinical Impression   Pt with decline in function and safety with ADLs and ADL mobility with decreased strength, balance, endurance and cognition. Pt's daughter concerned that is confusion and increased level of assist is too much for her at home and she stated that she is considering short term SNF for rehab. Pt demonstrates poor safety awareness and believes that [facility]. Pt would benefit from acute OT services to address impairments and increase level of function and safety    Follow Up Recommendations  Home health OT;Supervision/Assistance - 24 hour;SNF (may need to consider short term SNF due to cognition and if level of care exceeds what family can do at home. Pt's [facility]    Equipment Recommendations   TBD   Recommendations for Other Services       Precautions / Restrictions Precautions Precautions: Fall Precaution Comments: pt uses RW at home, dauhgter states that he is unsteady Restrictions Weight Bearing Restrictions: No      Mobility Bed Mobility Overal bed mobility: Needs Assistance Bed Mobility: Supine to Sit;Sit to Supine     Supine to sit: Min guard;HOB elevated Sit to supine: Min guard   General bed mobility comments: uses rails  Transfers Overall transfer level: Needs assistance   Transfers: Sit to/from BJ'sStand;Stand Pivot Transfers Sit to Stand: Min assist Stand pivot transfers: Mod assist       General transfer comment: pt attempting to stand without assist when asked to scoot to EOB in sitting    Balance Overall balance assessment: Needs assistance Sitting-balance support: No upper extremity supported;Feet supported Sitting balance-Leahy  Scale: Fair     Standing balance support: Bilateral upper extremity supported;During functional activity Standing balance-Leahy Scale: Poor                              ADL Overall ADL's : Needs assistance/impaired     Grooming: Wash/dry hands;Wash/dry face;Sitting;Min guard   Upper Body Bathing: Sitting;Min guard   Lower Body Bathing: Moderate assistance   Upper Body Dressing : Sitting;Min guard   Lower Body Dressing: Maximal assistance   Toilet Transfer: Moderate assistance;Cueing for safety;Stand-pivot;Cueing for sequencing   Toileting- Clothing Manipulation and Hygiene: Total assistance       Functional mobility during ADLs: Moderate assistance;Cueing for safety;Cueing for sequencing       Vision  wears glasses at all times, no change from baseline   Perception Perception Perception Tested?: No   Praxis Praxis Praxis tested?: Not tested    Pertinent Vitals/Pain Pain Assessment: No/denies pain     Hand Dominance Right   Extremity/Trunk Assessment Upper Extremity Assessment Upper Extremity Assessment: Generalized weakness   Lower Extremity Assessment Lower Extremity Assessment: Defer to PT evaluation   Cervical / Trunk Assessment Cervical / Trunk Assessment: Kyphotic   Communication Communication Communication: HOH   Cognition Arousal/Alertness: Awake/alert Behavior During Therapy: WFL for tasks assessed/performed Overall Cognitive Status: Impaired/Different from baseline Area of Impairment: Orientation;Memory Orientation Level: Disoriented to;Place;Time;Situation   Memory: Decreased short-term memory         General Comments: pt's dauhgter states that he had no confusion PTA   General Comments   pt pleasantly confused, cooperative. Daughter  supportive                 Home Living Family/patient expects to be discharged to:: Private residence Living Arrangements: Children Available Help at Discharge: Family;Personal care  attendant Type of Home: House Home Access: Stairs to enter [employer] of Steps: 1   Home Layout: One level     Bathroom Shower/Tub: Tub/shower unit;Walk-in shower   Bathroom Toilet: Standard     Home Equipment: Bedside commode;Walker - 2 wheels;Shower seat          Prior Functioning/Environment Level of Independence: Needs assistance  Gait / Transfers Assistance Needed: uses RW ADL's / Homemaking Assistance Needed: daughter assists pt with bathing and dressing on sometimes per family report        OT Diagnosis: Generalized weakness   OT Problem List: Decreased strength;Decreased knowledge of use of DME or AE;Decreased cognition;Decreased activity tolerance;Impaired balance (sitting and/or standing);Decreased safety awareness;Decreased knowledge of precautions   OT Treatment/Interventions: Self-care/ADL training;Therapeutic exercise;Patient/family education;Balance training;Therapeutic activities;DME and/or AE instruction    OT Goals(Current goals can be found in the care plan section) Acute Rehab OT Goals Patient Stated Goal: go to my house OT Goal Formulation: With patient/family Time For Goal Achievement: 12/05/14 Potential to Achieve Goals: Good ADL Goals Pt Will Perform Grooming: with set-up;with supervision;sitting Pt Will Perform Upper Body Bathing: with set-up;with supervision;sitting Pt Will Perform Lower Body Bathing: with min assist;with caregiver independent in assisting Pt Will Perform Upper Body Dressing: with set-up;with supervision;sitting Pt Will Transfer to Toilet: with min assist;bedside commode  OT Frequency: Min 2X/week   Barriers to D/C:    pt's dauhgter concerned that his current level of care exceeds assist that she is capable of                     End of Session Equipment Utilized During Treatment: Gait belt  Activity Tolerance: Patient tolerated treatment well Patient left: in bed;with call bell/phone within reach;with  bed alarm set;with family/visitor present   Time: 1914-7829 OT Time Calculation (min): 39 min Charges:  OT General Charges $OT Visit: 1 Procedure OT Evaluation $Initial OT Evaluation Tier I: 1 Procedure OT Treatments $Self Care/Home Management : 8-22 mins $Therapeutic Activity: 8-22 mins G-Codes:    Galen Manila 11/28/2014, 1:06 PM

## 2014-11-28 NOTE — Progress Notes (Signed)
Clinical Social Work Department BRIEF PSYCHOSOCIAL ASSESSMENT 11/28/2014  Patient:  TANER, RZEPKA     Account Number:  [id number]     Admit date:  11/14/2014  Clinical Social Worker:  Glorious Peach, CLINICAL SOCIAL WORKER  Date/Time:  11/28/2014 02:01 PM  Referred by:  Physician  Date Referred:  11/28/2014 Referred for  SNF Placement   Other Referral:   Interview type:  Family Other interview type:   Daughters at bedside, Katharine Look and [employer]    PSYCHOSOCIAL DATA Living Status:  WITH ADULT CHILDREN Admitted from facility:   Level of care:   Primary support name:  Lita Mains Primary support relationship to patient:  CHILD, ADULT Degree of support available:   Adequate    CURRENT CONCERNS Current Concerns  Post-Acute Placement   Other Concerns:    SOCIAL WORK ASSESSMENT / PLAN CSW received consult stating that pt would benefit from short term rehab at a SNF. CSW completed FL2 and faxed pt out to [facility].   Assessment/plan status:  Information/Referral to [employer] Other assessment/ plan:   Information/referral to community resources:   CSW completed FL2 and received bed offers. CSW gave bed offer list to daughters who are at pt's bedside. Family briefly went over list and made a selection for pt to go to after DC.    PATIENT'S/FAMILY'S RESPONSE TO PLAN OF CARE: CSW met with family at bedside. Both daughters were at bedside, Katharine Look and Vickii Chafe. Family states that they thought they would be able to take pt home, they never seen pt at this particular state. CSW informed pt that OT/PT usually makes valid recommendation when they feel the pt could benefit form SNF. Daughters state that they aren't too familiar with SNFs in [facility] but called around to ask friends if they were familiar with any. CSW gave daughters a list of SNF that are able to make bed offers, and family chose [facility]. CSW contacted [facility] to notify them that family has chose  a bed with them. CSW will continue to follow for further DC planning.       Glorious Peach BSW Intern

## 2014-11-28 NOTE — Progress Notes (Signed)
Daughter requesting [facility] choice (CSW will check in the morning re: bed availability) & [facility] as a backup. CSW confirmed with Jasmine DecemberSharon at [facility] that they would be able to offer a bed if [facility]. CSW will follow-up in the morning.   Clinical Social Work Department CLINICAL SOCIAL WORK PLACEMENT NOTE 11/28/2014  Patient:  Mertie MooresKENDALL,Heberto  Account Number:  [id number] Admit date:  11/09/2014  Clinical Social Worker:  Orpah GreekKELLY FOLEY, LCSWA  Date/time:  11/28/2014 03:37 PM  Clinical Social Work is seeking post-discharge placement for this patient at the following level of care:   SKILLED NURSING   (*CSW will update this form in Epic as items are completed)   11/28/2014  Patient/family provided with [facility] of Clinical Social Work's list of facilities offering this level of care within the geographic area requested by the patient (or if unable, by the patient's family).  11/28/2014  Patient/family informed of their freedom to choose among providers that offer the needed level of care, that participate in Medicare, Medicaid or managed care program needed by the patient, have an available bed and are willing to accept the patient.  11/28/2014  Patient/family informed of [facility]' ownership interest in [facility], as well as of the fact that they are under no obligation to receive care at this facility.  PASARR submitted to EDS on 11/28/2014 PASARR number received on 11/28/2014  FL2 transmitted to all facilities in geographic area requested by pt/family on  11/28/2014 FL2 transmitted to all facilities within larger geographic area on   Patient informed that his/her managed care company has contracts with or will negotiate with  certain facilities, including the following:     Patient/family informed of bed offers received:  11/28/2014 Patient chooses bed at  Physician recommends and patient chooses bed at    Patient to be transferred to   on   Patient to be transferred to facility by  Patient and family notified of transfer on  Name of family member notified:    The following physician request were entered in Epic:   Additional Comments:      Lincoln MaxinKelly Jaise Moser, LCSW [facility] Clinical Social Worker cell #: [phone]

## 2014-11-28 NOTE — Evaluation (Signed)
Physical Therapy Evaluation Patient Details Name: Darren Pollard MRN: [id number] DOB: [birth year] Today's Date: 11/28/2014   History of Present Illness  Pt is 81 y.o. male with history of arthritis, insomnia, anxiety, gait disorder, and osteoporosis  Pt admitted with GI bleed.  Clinical Impression  Pt admitted with above and currently with functional limitations due to the deficits listed below (see PT problem list). Pt did not tolerate session well today. Pt seemed anxious during session and required total assist for transfers with RW. Pt cognition is impaired and he is disoriented. The daughter would like for pt go to SNF before returning home, she feels she is not able to care for him in his current state. Pt will benefit from skilled PT to increase their independence and safety with mobility to allow discharge to SNF.     Follow Up Recommendations SNF    Equipment Recommendations  None recommended by PT    Recommendations for Other Services       Precautions / Restrictions Precautions Precautions: Fall Precaution Comments: pt uses RW at home, dauhgter states that he is unsteady. Restrictions Weight Bearing Restrictions: No      Mobility  Bed Mobility Overal bed mobility: Needs Assistance Bed Mobility: Supine to Sit     Supine to sit: HOB elevated;Mod assist Sit to supine: Min guard   General bed mobility comments: assist needed to get LEs off of bed. utilized bed pad to turn pt and move him towards EOB.  Transfers Overall transfer level: Needs assistance Equipment used: Rolling walker (2 wheeled) Transfers: Sit to/from [employer] Sit to Stand: Max assist;+2 physical assistance Stand pivot transfers: Total assist;+2 physical assistance       General transfer comment: pt needed total assist to maintain balance when going from bed to chair, pt kept losing balance posteriorly. Pt had tremors during transfer. max assist for rise and controlling descent.  verbal cues given for safe technique with RW.  Ambulation/Gait                Stairs            Wheelchair Mobility    Modified Rankin (Stroke Patients Only)       Balance Overall balance assessment: Needs assistance Sitting-balance support: No upper extremity supported;Feet supported Sitting balance-Leahy Scale: Fair     Standing balance support: Bilateral upper extremity supported Standing balance-Leahy Scale: Zero Standing balance comment: pt had a strong consistent posterior lean                             Pertinent Vitals/Pain Pain Assessment: No/denies pain    Home Living Family/patient expects to be discharged to:: Private residence Living Arrangements: Children Available Help at Discharge: Family;Personal care attendant Type of Home: House Home Access: Stairs to enter   [employer] of Steps: 1 Home Layout: One level Home Equipment: Bedside commode;Walker - 2 wheels;Shower seat      Prior Function Level of Independence: Needs assistance   Gait / Transfers Assistance Needed: uses RW  ADL's / Homemaking Assistance Needed: daughter assists pt with bathing and dressing on sometimes per family report        Hand Dominance   Dominant Hand: Right    Extremity/Trunk Assessment   Upper Extremity Assessment: Generalized weakness           Lower Extremity Assessment: Generalized weakness      Cervical / Trunk Assessment: Kyphotic  Communication  Communication: HOH  Cognition Arousal/Alertness: Awake/alert Behavior During Therapy: Anxious Overall Cognitive Status: Impaired/Different from baseline Area of Impairment: Orientation;Memory;Safety/judgement Orientation Level: Disoriented to;Place;Time;Situation   Memory: Decreased short-term memory   Safety/Judgement: Decreased awareness of safety     General Comments: pt's dauhgter states that he had no confusion PTA    General Comments      Exercises         Assessment/Plan    PT Assessment Patient needs continued PT services  PT Diagnosis Difficulty walking;Generalized weakness;Altered mental status   PT Problem List Decreased strength;Decreased activity tolerance;Decreased mobility;Decreased safety awareness;Decreased knowledge of use of DME;Decreased cognition;Decreased balance  PT Treatment Interventions DME instruction;Gait training;Functional mobility training;Therapeutic exercise;Therapeutic activities;Balance training;Cognitive remediation;Patient/family education   PT Goals (Current goals can be found in the Care Plan section) Acute Rehab PT Goals Patient Stated Goal: go to my house PT Goal Formulation: With patient/family Time For Goal Achievement: 12/12/14 Potential to Achieve Goals: Good    Frequency Min 3X/week   Barriers to discharge        Co-evaluation               End of Session Equipment Utilized During Treatment: Gait belt Activity Tolerance: Patient limited by fatigue Patient left: in chair;with call bell/phone within reach;with chair alarm set;with family/visitor present Nurse Communication: Mobility status         Time: 1502-1520 PT Time Calculation (min) (ACUTE ONLY): 18 min   Charges:   PT Evaluation $Initial PT Evaluation Tier I: 1 Procedure     PT G Codes:        Darren Pollard 11/28/2014, 4:36 PM  Darren Pollard, SPT

## 2014-11-29 ENCOUNTER — Inpatient Hospital Stay (HOSPITAL_COMMUNITY): Payer: Medicare Other

## 2014-11-29 LAB — CBC
HEMATOCRIT: 27 % — AB (ref 39.0–52.0)
Hemoglobin: 8.7 g/dL — ABNORMAL LOW (ref 13.0–17.0)
MCH: 30.6 pg (ref 26.0–34.0)
MCHC: 32.2 g/dL (ref 30.0–36.0)
MCV: 95.1 fL (ref 78.0–100.0)
PLATELETS: 142 10*3/uL — AB (ref 150–400)
RBC: 2.84 MIL/uL — ABNORMAL LOW (ref 4.22–5.81)
RDW: 14.8 % (ref 11.5–15.5)
WBC: 6.7 10*3/uL (ref 4.0–10.5)

## 2014-11-29 MED ORDER — PIPERACILLIN-TAZOBACTAM 3.375 G IVPB
3.3750 g | Freq: Three times a day (TID) | INTRAVENOUS | Status: DC
  Administered 2014-11-29 – 2014-12-01 (×7): 3.375 g via INTRAVENOUS
  Filled 2014-11-29 (×8): qty 50

## 2014-11-29 MED ORDER — ALBUTEROL SULFATE (2.5 MG/3ML) 0.083% IN NEBU
2.5000 mg | INHALATION_SOLUTION | Freq: Four times a day (QID) | RESPIRATORY_TRACT | Status: DC | PRN
  Administered 2014-11-29: 2.5 mg via RESPIRATORY_TRACT
  Filled 2014-11-29: qty 3

## 2014-11-29 NOTE — Progress Notes (Signed)
[facility]. [facility] & daughter, Darren Pollard aware. Please contact weekend CSW, Darren Pollard (ph#: [phone]) when ready for discharge.   Clinical Social Work Department CLINICAL SOCIAL WORK PLACEMENT NOTE 11/29/2014  Patient:  Darren Pollard,Darren Pollard  Account Number:  [id number] Admit date:  06/19/2015  Clinical Social Worker:  Orpah GreekKELLY FOLEY, LCSWA  Date/time:  11/28/2014 03:37 PM  Clinical Social Work is seeking post-discharge placement for this patient at the following level of care:   SKILLED NURSING   (*CSW will update this form in Epic as items are completed)   11/28/2014  Patient/family provided with [facility] Department of Clinical Social Work's list of facilities offering this level of care within the geographic area requested by the patient (or if unable, by the patient's family).  11/28/2014  Patient/family informed of their freedom to choose among providers that offer the needed level of care, that participate in Medicare, Medicaid or managed care program needed by the patient, have an available bed and are willing to accept the patient.  11/28/2014  Patient/family informed of [facility] ownership interest in [facility], as well as of the fact that they are under no obligation to receive care at this facility.  PASARR submitted to EDS on 11/28/2014 PASARR number received on 11/28/2014  FL2 transmitted to all facilities in geographic area requested by pt/family on  11/28/2014 FL2 transmitted to all facilities within larger geographic area on   Patient informed that his/her managed care company has contracts with or will negotiate with  certain facilities, including the following:     Patient/family informed of bed offers received:  11/28/2014 [facility] Physician recommends and patient chooses bed at    [facility] on   11/30/2014 Patient to be transferred to facility by PTAR Patient and family notified of transfer on  Name of family member notified:    The following physician request were entered in Epic:   Additional Comments:    Lincoln MaxinKelly Maddalena Linarez, LCSW [facility] Clinical Social Worker cell #: [phone]

## 2014-11-29 NOTE — Progress Notes (Signed)
Patient ID: Darren Pollard, male   DOB: [birth year], [age] y.o.   MRN: [id number] [facility] PROGRESS NOTE  Darren Pollard [id number] DOB: [birth year] DOA: 07/12/2015 PCP: Londell MohPHARR,WALTER DAVIDSON, MD  Brief narrative:    [age] y.o. male with past medical history of arthritis, insomnia who presented with black stools for last 3 days prior to this admission. Patient did not report associated abdominal pain, nausea or vomiting. No associated weight loss. He has been taking ibuprofen for arthritis once or twice daily and at times he's taking aspirin. On admission, patient was hemodynamically stable he was found to have Hemoccult-positive stool. Patient does not want endoscopies or other similar interventions. He was admitted to monitor for GI bleed. Social work assisting discharge planning to skilled nursing facility.  Assessment/Plan:    Principal problem: Upper GI bleed secondary to NSAIDS / acute blood loss anemia - Patient presented with black stools for past few days prior to this admission. He was on ibuprofen for arthritic pain.  - No evidence of gross bleeding. - Hemoglobin is stable, range is 8.7 - 9.6 - Continue Protonix IV daily. - May stop IV fluids to prevent volume overload. Patient is a regular diet.  Active Problems: Thrombocytopenia - Unclear etiology. Platelet count 142. Stable - Using SCDs for DVT prophylaxis.  Arthritis - Avoid NSAIDs.  - Plan to discharge to skilled nursing facility    DVT Prophylaxis  - SCDs bilaterally while patient is in hospital    Code Status: DNR/DNI Family Communication:  Updated family at the bedside. Disposition Plan: to skilled nursing facility likely next 24-48 hours.   IV access:  Peripheral IV  Procedures and diagnostic studies:    No results found.  Medical Consultants:  None   Other Consultants:  Physical therapy   IAnti-Infectives:   None   Manson PasseyEVINE, ALMA, MD  [facility] Pager [phone]  If 7PM-7AM,  please contact night-coverage [email or web address] Password [email or web address] 11/29/2014, 9:29 AM   LOS: 3 days    HPI/Subjective: No acute overnight events.  Objective: Filed Vitals:   11/28/14 0822 11/28/14 1445 11/28/14 2016 11/29/14 0445  BP: 139/86 140/91 116/66 150/76  Pulse: 87 93 96 95  Temp:  98.1 F (36.7 C) 98.7 F (37.1 C) 97.2 F (36.2 C)  TempSrc:  Oral Oral Oral  Resp: 18 20 20 18   Height:      Weight:      SpO2: 95% 92% 94%     Intake/Output Summary (Last 24 hours) at 11/29/14 0929 Last data filed at 11/29/14 0900  Gross per 24 hour  Intake   2180 ml  Output    450 ml  Net   1730 ml    Exam:   General:  Pt is not in distress, awake  Cardiovascular: S1, S2, rate controlled   Respiratory: More congested this morning, coarse breath sounds, no wheezing  Abdomen: Appreciate bowel sounds, nontender abdomen  Extremities: Lower extremity swelling appreciated, pulses palpable  Neuro: Nonfocal  Data Reviewed: Basic Metabolic Panel:  Recent Labs Lab 2014-12-18 1327 11/27/14 0419  NA 138 138  K 4.8 4.1  CL 106 109  CO2 22 24  GLUCOSE 95 92  BUN 61* 48*  CREATININE 1.22 1.04  CALCIUM 8.9 8.5   Liver Function Tests: No results for input(s): AST, ALT, ALKPHOS, BILITOT, PROT, ALBUMIN in the last 168 hours. No results for input(s): LIPASE, AMYLASE in the last 168 hours. No results for input(s): AMMONIA in the last 168 hours. CBC:  Recent Labs Lab 2014-12-18  1327  11/27/14 0419 11/27/14 1605 11/28/14 0340 11/28/14 1610 11/29/14 0345  WBC 7.8  < > 6.2 5.3 6.4 10.4 6.7  NEUTROABS 6.2  --   --   --   --   --   --   HGB 9.7*  < > 9.1* 9.2* 8.6* 9.6* 8.7*  HCT 29.8*  < > 27.8* 28.3* 26.4* 29.7* 27.0*  MCV 95.2  < > 94.6 95.9 94.3 94.9 95.1  PLT 89*  < > 131* 140* 130* 150 142*  < > = values in this interval not displayed. Cardiac Enzymes: No results for input(s): CKTOTAL, CKMB, CKMBINDEX, TROPONINI in the last 168 hours. BNP: Invalid input(s): POCBNP CBG: No  results for input(s): GLUCAP in the last 168 hours.  No results found for this or any previous visit (from the past 240 hour(s)).   Scheduled Meds: . mirtazapine  15 mg Oral QPM  . pantoprazole (PROTONIX) IV  40 mg Intravenous Daily  . zolpidem  5 mg Oral QHS   Continuous Infusions: . sodium chloride 50 mL/hr at 11/28/14 [id number]

## 2014-11-29 DEATH — deceased

## 2014-11-30 DIAGNOSIS — J181 Lobar pneumonia, unspecified organism: Secondary | ICD-10-CM

## 2014-11-30 DIAGNOSIS — A419 Sepsis, unspecified organism: Secondary | ICD-10-CM

## 2014-11-30 LAB — URINALYSIS, ROUTINE W REFLEX MICROSCOPIC
BILIRUBIN URINE: NEGATIVE
Glucose, UA: NEGATIVE mg/dL
Ketones, ur: NEGATIVE mg/dL
LEUKOCYTES UA: NEGATIVE
Nitrite: NEGATIVE
Protein, ur: NEGATIVE mg/dL
Specific Gravity, Urine: 1.018 (ref 1.005–1.030)
UROBILINOGEN UA: 1 mg/dL (ref 0.0–1.0)
pH: 5 (ref 5.0–8.0)

## 2014-11-30 LAB — URINE MICROSCOPIC-ADD ON

## 2014-11-30 LAB — TYPE AND SCREEN
ABO/RH(D): A NEG
ANTIBODY SCREEN: POSITIVE
DAT, IGG: NEGATIVE
DONOR AG TYPE: NEGATIVE
Donor AG Type: NEGATIVE
PT AG Type: NEGATIVE
UNIT DIVISION: 0
Unit division: 0

## 2014-11-30 MED ORDER — MORPHINE SULFATE 4 MG/ML IJ SOLN: 4.0000 mg | Freq: Once | INTRAMUSCULAR | Status: DC

## 2014-11-30 MED ORDER — PROMETHAZINE HCL 25 MG/ML IJ SOLN: 25.0000 mg | Freq: Once | INTRAMUSCULAR | Status: DC

## 2014-11-30 MED ORDER — LORAZEPAM 2 MG/ML IJ SOLN
0.5000 mg | INTRAMUSCULAR | Status: DC | PRN
  Administered 2014-11-30 – 2014-12-02 (×9): 0.5 mg via INTRAVENOUS
  Filled 2014-11-30 (×9): qty 1

## 2014-11-30 MED ORDER — MORPHINE SULFATE 2 MG/ML IJ SOLN
1.0000 mg | Freq: Once | INTRAMUSCULAR | Status: AC
Start: 2014-11-30 — End: 2014-11-30
  Administered 2014-11-30: 1 mg via INTRAVENOUS
  Filled 2014-11-30: qty 1

## 2014-11-30 MED ORDER — SCOPOLAMINE 1 MG/3DAYS TD PT72
1.0000 | MEDICATED_PATCH | TRANSDERMAL | Status: DC
  Administered 2014-11-30: 1.5 mg via TRANSDERMAL
  Filled 2014-11-30: qty 1

## 2014-11-30 MED ORDER — MORPHINE SULFATE 2 MG/ML IJ SOLN
1.0000 mg | INTRAMUSCULAR | Status: DC | PRN
  Administered 2014-11-30 – 2014-12-02 (×9): 1 mg via INTRAVENOUS
  Filled 2014-11-30 (×9): qty 1

## 2014-11-30 MED ORDER — LORAZEPAM 2 MG/ML IJ SOLN
0.5000 mg | Freq: Four times a day (QID) | INTRAMUSCULAR | Status: DC | PRN
  Administered 2014-11-30: 0.5 mg via INTRAVENOUS
  Filled 2014-11-30: qty 1

## 2014-11-30 MED ORDER — MORPHINE SULFATE 2 MG/ML IJ SOLN
1.0000 mg | Freq: Once | INTRAMUSCULAR | Status: AC
  Administered 2014-11-30: 1 mg via INTRAVENOUS
  Filled 2014-11-30: qty 1

## 2014-11-30 NOTE — Progress Notes (Signed)
Patient ID: Darren Pollard, male   DOB: 04/18/1911, 81 y.o.   MRN: [id number] [facility] PROGRESS NOTE  Darren Pollard [id number]:[id number] DOB: [birth year] DOA: 11/06/2014 PCP: Horatio Pel, MD  Brief narrative:    81 y.o. male with past medical history of arthritis, insomnia who presented with black stools for last 3 days prior to this admission.  No further work up done since pt declined having diagnostic studies. Hospital course complicated with worsening respiratory distress. CXR on 11/29/2014 showed lingular opacity. He was started on zosyn. Due to significant decline in past couple of hours family wants to pursue comfort care.  Assessment/Plan:    Principal problem: Upper GI bleed secondary to NSAIDS / acute blood loss anemia - Patient presented with black stools for past few days prior to this admission. He was on ibuprofen for arthritic pain.  - He has declined diagnostic studies on admission. - Hemoglobin stable, pt has not received any transfusion throughout the hospital stay. - He was on protonix IV since admission but since family wants to focus on comfort we will stop protonix.  Active Problems: Sepsis secondary to lobar pneumonia - Sepsis criteria met on 11/29/2014 - 11/30/2014 with fever, tachycardia, tachypnea. Lingular opacity seen on CXR 11/29/2014. - Pt started on zosyn but family wants to pursue comfort care but ok with continuing antibiotics.  Thrombocytopenia - Unclear etiology. Platelet count 142. Stable - Using SCDs for DVT prophylaxis.  Arthritis - on morphine for comfort care.   DVT Prophylaxis  - SCDs bilaterally while patient is in hospital    Code Status: DNR/DNI Family Communication:  Updated family at the bedside. Disposition Plan: prognosis guarded, comfort care    IV access:  Peripheral IV  Procedures and diagnostic studies:    Dg Chest Port 1v Same Day 11/29/2014    Lingular opacity, likely chronic scarring versus atelectasis.      Medical Consultants:  None   Other Consultants:  Physical therapy   IAnti-Infectives:   None   Leisa Lenz, MD  [facility] Pager [phone]  If 7PM-7AM, please contact night-coverage [email or web address] Password [email or web address] 11/30/2014, 9:45 AM   LOS: 4 days    HPI/Subjective: No acute overnight events.  Objective: Filed Vitals:   11/29/14 2300 11/30/14 0040 11/30/14 0540 11/30/14 0800  BP:   121/60   Pulse:   100   Temp: 102.5 F (39.2 C)  101 F (38.3 C) 102.7 F (39.3 C)  TempSrc: Rectal  Axillary Axillary  Resp:   28   Height:      Weight:      SpO2:  93% 85%     Intake/Output Summary (Last 24 hours) at 11/30/14 0945 Last data filed at 11/30/14 8338  Gross per 24 hour  Intake    240 ml  Output    825 ml  Net   -585 ml    Exam:   General:  Pt is on VM; not in distress  Cardiovascular: S1, S2, tachycardic  Respiratory: gurgling sounds, rhonchi (+)  Abdomen: non tender abdomen, non distended, (+) BS  Extremities: (+) LE edema, pulses palpable   Neuro: No focal deficits   Data Reviewed: Basic Metabolic Panel:  Recent Labs Lab 11/14/2014 1327 11/27/14 0419  NA 138 138  K 4.8 4.1  CL 106 109  CO2 22 24  GLUCOSE 95 92  BUN 61* 48*  CREATININE 1.22 1.04  CALCIUM 8.9 8.5   Liver Function Tests: No results for input(s): AST, ALT, ALKPHOS, BILITOT, PROT, ALBUMIN in the  last 168 hours. No results for input(s): LIPASE, AMYLASE in the last 168 hours. No results for input(s): AMMONIA in the last 168 hours. CBC:  Recent Labs Lab 11/08/2014 1327  11/27/14 0419 11/27/14 1605 11/28/14 0340 11/28/14 1610 11/29/14 0345  WBC 7.8  < > 6.2 5.3 6.4 10.4 6.7  NEUTROABS 6.2  --   --   --   --   --   --   HGB 9.7*  < > 9.1* 9.2* 8.6* 9.6* 8.7*  HCT 29.8*  < > 27.8* 28.3* 26.4* 29.7* 27.0*  MCV 95.2  < > 94.6 95.9 94.3 94.9 95.1  PLT 89*  < > 131* 140* 130* 150 142*  < > = values in this interval not displayed. Cardiac Enzymes: No results for  input(s): CKTOTAL, CKMB, CKMBINDEX, TROPONINI in the last 168 hours. BNP: Invalid input(s): POCBNP CBG: No results for input(s): GLUCAP in the last 168 hours.  No results found for this or any previous visit (from the past 240 hour(s)).   Scheduled Meds: . piperacillin-tazobactam (ZOSYN)  IV  3.375 g Intravenous 3 times per day  . scopolamine  1 patch Transdermal Q72H   Continuous Infusions: . sodium chloride 10 mL/hr (11/29/14 0931)

## 2014-11-30 NOTE — Progress Notes (Signed)
RN requested RT NTS PT- RT and MD assessed PT and NTS is not required at this time. RT followed up with RN.

## 2014-11-30 NOTE — Progress Notes (Signed)
Call placed to on call provider, M. Burnadette PeterLynch, NP. Family at bedside with concerns that patient was transitioning. Provider asked to come to speak with family to ease concerns. Orders received to place patient on venti mask and RN informed that provider would be up to speak with the family.

## 2014-11-30 NOTE — Progress Notes (Signed)
ANTIBIOTIC CONSULT NOTE - INITIAL  Pharmacy Consult for zosyn Indication: Empiric, fever  Allergies  Allergen Reactions  . Bee Venom     Reaction to yellow jackets in past and had to go to hospital  . Biaxin [Clarithromycin] Itching  . Tums [Calcium Carbonate Antacid] Other (See Comments)    constipation    Patient Measurements: Height: 5' 0.8" (154.4 cm) Weight: 145 lb 1 oz (65.8 kg) IBW/kg (Calculated) : 51.84 Adjusted Body Weight:   Vital Signs: Temp: 102.5 F (39.2 C) (04/01 2300) Temp Source: Rectal (04/01 2300) BP: 157/92 mmHg (04/01 2106) Pulse Rate: 90 (04/01 2106) Intake/Output from previous day: 04/01 0701 - 04/02 0700 In: 480 [P.O.:480] Out: 775 [Urine:775] Intake/Output from this shift: Total I/O In: -  Out: 275 [Urine:275]  Labs:  Recent Labs  11/27/14 0419  11/28/14 0340 11/28/14 1610 11/29/14 0345  WBC 6.2  < > 6.4 10.4 6.7  HGB 9.1*  < > 8.6* 9.6* 8.7*  PLT 131*  < > 130* 150 142*  CREATININE 1.04  --   --   --   --   < > = values in this interval not displayed. Estimated Creatinine Clearance: 29.9 mL/min (by C-G formula based on Cr of 1.04). No results for input(s): VANCOTROUGH, VANCOPEAK, VANCORANDOM, GENTTROUGH, GENTPEAK, GENTRANDOM, TOBRATROUGH, TOBRAPEAK, TOBRARND, AMIKACINPEAK, AMIKACINTROU, AMIKACIN in the last 72 hours.   Microbiology: No results found for this or any previous visit (from the past 720 hour(s)).  Medical History: Past Medical History  Diagnosis Date  . Anxiety   . Insomnia   . BPH (benign prostatic hyperplasia)   . Nocturia   . Leg weakness   . Dizziness   . Orthostasis   . Gait disorder   . Constipation   . Osteoporosis   . GERD (gastroesophageal reflux disease)   . Tinnitus   . Chronic kidney disease   . Eczema   . Erectile dysfunction   . Dupuytren's contracture   . History of hiatal hernia   . Hemorrhoids   . Actinic keratoses   . Aspirin long-term use   . Leg edema     Medications:   Anti-infectives    Start     Dose/Rate Route Frequency Ordered Stop   11/29/14 2315  piperacillin-tazobactam (ZOSYN) IVPB 3.375 g     3.375 g 12.5 mL/hr over 240 Minutes Intravenous 3 times per day 11/29/14 2310       Assessment: Patient admit for GIB, now with fever and unclear source of infection.  Empiric zosyn ordered per Pharmacy.  Goal of Therapy:  Zosyn based on renal function   Plan:  Follow up culture results  Zosyn 3.375g IV Q8H infused over 4hrs.   Darlina GuysGrimsley Jr, Jacquenette ShoneJulian Crowford 11/30/2014,4:09 AM

## 2014-11-30 NOTE — Progress Notes (Signed)
Patient with labored breathing, increased confusion, crackles and temp of 102.5 rectally. Call placed to M. Burnadette PeterLynch, NP on call. Orders received for portable CXR, blood cx x 2, UA and culture. Tylenol given per rectum and zosyn abx started.

## 2014-11-30 NOTE — Progress Notes (Signed)
OT Cancellation Note  Patient Details Name: Darren Pollard MRN: [id number] DOB: 1910/09/18   Cancelled Treatment:    Reason Eval/Treat Not Completed: Medical issues which prohibited therapy  Ladell Bey, Metro KungLorraine D 11/30/2014, 3:21 PM

## 2014-12-01 DIAGNOSIS — J189 Pneumonia, unspecified organism: Secondary | ICD-10-CM

## 2014-12-01 NOTE — Progress Notes (Signed)
Patient ID: Darren Pollard, male   DOB: 11/22/1910, 81 y.o.   MRN: [id number] [facility] PROGRESS NOTE  Darren Pollard [id number]:[id number] DOB: [birth year] DOA: 11/24/2014 PCP: Horatio Pel, MD  Brief narrative:    81 y.o. male with past medical history of arthritis, insomnia who presented with black stools for last 3 days prior to this admission.  No further work up done since pt declined having diagnostic studies. Hospital course complicated with worsening respiratory distress. CXR on 11/29/2014 showed lingular opacity. He was started on zosyn. Due to significant decline  Family has opted for comfort care.  Assessment/Plan:    Principal problem: Upper GI bleed secondary to NSAIDS / acute blood loss anemia - Patient presented with black stools for past few days prior to this admission. He was on ibuprofen for arthritic pain.  - He has declined diagnostic studies on admission. - Hemoglobin stable. -  No further blood work to focus on comfort.  Active Problems: Sepsis secondary to lobar pneumonia - Sepsis criteria met on 11/29/2014 - 11/30/2014 with fever, tachycardia, tachypnea. Lingular opacity seen on CXR 11/29/2014. - Pt started on zosyn.  We will stop antibiotics today since family wants to focus on comfort.  Thrombocytopenia - Unclear etiology. Platelet count 142. Stable - Using SCDs for DVT prophylaxis.  Arthritis - on morphine  When necessaryfor comfort care.   DVT Prophylaxis  - SCDs bilaterally while patient is in hospital    Code Status: DNR/DNI Family Communication:  Updated family at the bedside.    IV access:  Peripheral IV  Procedures and diagnostic studies:    Dg Chest Port 1v Same Day 11/29/2014    Lingular opacity, likely chronic scarring versus atelectasis.     Medical Consultants:  None   Other Consultants:  Physical therapy   IAnti-Infectives:   None   Leisa Lenz, MD  [facility] Pager [phone]  If 7PM-7AM, please contact  night-coverage [email or web address] Password [email or web address] 12/01/2014, 4:55 PM   LOS: 5 days    HPI/Subjective: No acute overnight events.  Objective: Filed Vitals:   11/30/14 2045 12/01/14 0536 12/01/14 1331 12/01/14 1407  BP: 114/79 98/60 125/72   Pulse: 92 88 106   Temp: 99.4 F (37.4 C) 98.3 F (36.8 C) 98.1 F (36.7 C)   TempSrc: Axillary Axillary Axillary   Resp: 24 24 32   Height:      Weight:      SpO2: 96% 81% 78% 86%    Intake/Output Summary (Last 24 hours) at 12/01/14 1655 Last data filed at 12/01/14 0538  Gross per 24 hour  Intake    100 ml  Output    350 ml  Net   -250 ml    Exam:   General:  Pt on  Ventimask, no distress  Cardiovascular: S1, S2, tachycardic  Respiratory: gurgling sounds appreciated, rhonchi appreciated  Abdomen:  Abdomen nontender, nondistended  Extremities:  Lower extremity swelling appreciated, palpable pulses  Neuro:  nonfocal.  Data Reviewed: Basic Metabolic Panel:  Recent Labs Lab 11/06/2014 1327 11/27/14 0419  NA 138 138  K 4.8 4.1  CL 106 109  CO2 22 24  GLUCOSE 95 92  BUN 61* 48*  CREATININE 1.22 1.04  CALCIUM 8.9 8.5   Liver Function Tests: No results for input(s): AST, ALT, ALKPHOS, BILITOT, PROT, ALBUMIN in the last 168 hours. No results for input(s): LIPASE, AMYLASE in the last 168 hours. No results for input(s): AMMONIA in the last 168 hours. CBC:  Recent Labs Lab 11/13/2014 1327  11/27/14 0419 11/27/14 1605 11/28/14 0340 11/28/14 1610 11/29/14 0345  WBC 7.8  < > 6.2 5.3 6.4 10.4 6.7  NEUTROABS 6.2  --   --   --   --   --   --   HGB 9.7*  < > 9.1* 9.2* 8.6* 9.6* 8.7*  HCT 29.8*  < > 27.8* 28.3* 26.4* 29.7* 27.0*  MCV 95.2  < > 94.6 95.9 94.3 94.9 95.1  PLT 89*  < > 131* 140* 130* 150 142*  < > = values in this interval not displayed. Cardiac Enzymes: No results for input(s): CKTOTAL, CKMB, CKMBINDEX, TROPONINI in the last 168 hours. BNP: Invalid input(s): POCBNP CBG: No results for input(s): GLUCAP in  the last 168 hours.  Recent Results (from the past 240 hour(s))  Culture, blood (routine x 2)     Status: None (Preliminary result)   Collection Time: 11/29/14 11:40 PM  Result Value Ref Range Status   Specimen Description BLOOD RIGHT ARM  Final   Special Requests BOTTLES DRAWN AEROBIC AND ANAEROBIC 2CC  Final   Culture   Final           BLOOD CULTURE RECEIVED NO GROWTH TO DATE CULTURE WILL BE HELD FOR 5 DAYS BEFORE ISSUING A FINAL NEGATIVE REPORT Performed at [facility]    Report Status PENDING  Incomplete  Culture, blood (routine x 2)     Status: None (Preliminary result)   Collection Time: 11/29/14 11:45 PM  Result Value Ref Range Status   Specimen Description BLOOD RIGHT HAND  Final   Special Requests BOTTLES DRAWN AEROBIC AND ANAEROBIC 5CC  Final   Culture   Final           BLOOD CULTURE RECEIVED NO GROWTH TO DATE CULTURE WILL BE HELD FOR 5 DAYS BEFORE ISSUING A FINAL NEGATIVE REPORT Performed at [facility]    Report Status PENDING  Incomplete  Culture, Urine     Status: None (Preliminary result)   Collection Time: 11/30/14 12:02 AM  Result Value Ref Range Status   Specimen Description URINE, CLEAN CATCH  Final   Special Requests NONE  Final   Colony Count   Final    30,000 COLONIES/ML Performed at [facility]    Culture   Final    ESCHERICHIA COLI Performed at [facility]    Report Status PENDING  Incomplete     Scheduled Meds: . piperacillin-tazobactam (ZOSYN)  IV  3.375 g Intravenous 3 times per day  . scopolamine  1 patch Transdermal Q72H   Continuous Infusions: . sodium chloride 10 mL/hr (11/29/14 0931)

## 2014-12-01 NOTE — Clinical Social Work Note (Signed)
CSW reviewed pt chart that reflected pt's "prognosis guarded, comfort care" so CSW met with pt and family at bedside  Pt had full oxygen mask on and pt's son was sitting holding his hand  CSW introduced herself and provided a description of her role.  CSW encouraged pt's family (3 siblings present) to discuss pt's history and provided active listening related to pt's life.  CSW explained that CSW and chaplains are here to provide more support if needed and asked family to talk about their needs  Pt's family stated that pt was supposed to go to SNF this weekend but he "won't make it".  Pt's family stated that there are 2 more siblings on their way into town and they are hoping pt can remain alive until they arrive.  Pt's church representative called while CSW was at bedside and family stated that they will offer pt up in morning services and this made them "happy"  Pt's family grateful for support and stated they would call CSW if needed  .Dede Query, LCSW [facility] Clinical Social Worker - Weekend Coverage cell #: [phone]

## 2014-12-02 LAB — URINE CULTURE: Colony Count: 30000

## 2014-12-06 LAB — CULTURE, BLOOD (ROUTINE X 2)
CULTURE: NO GROWTH
Culture: NO GROWTH

## 2014-12-09 NOTE — Progress Notes (Signed)
ED CM received a call from daughter, Gigi Gineggy and her husband Max to attempt to speak with Dr Elisabeth Pigeonevine/ 1306 MD paged x 2 Left Peggy contact number in pager [phone] Return call from Dr Elisabeth Pigeonevine Form given to Unit secretary on 4W per MD request

## 2014-12-29 NOTE — Progress Notes (Signed)
Patient pronounced by Virgel PalingFelicia Alfie Rideaux, RN and Francesco SorKaren Lockwood, RN at 2:00 am.

## 2014-12-29 NOTE — Discharge Summary (Signed)
  Death Summary  Darren Pollard BLT:[id number] DOB: 1911-01-06 DOA: 12-06-2014  PCP: Horatio Pel, MD  Admit date: 2014/12/06 Date of Death: 12-19-14  Final Diagnoses:  Active Problems:   GI bleed due to NSAIDs   GI bleed    History of present illness:  81 y.o. male with past medical history of arthritis, insomnia who presented with black stools for past 3 days prior to this admission.  Patient declined diagnostic studies. Hospital course complicated with worsening respiratory distress. CXR on 11/29/2014 showed lingular opacity. Patient was started on zosyn. Due to significant declinefamily has opted for comfort care. I was not present at the time pt expired.  Assessment/Plan:    Principal problem: Upper GI bleed secondary to NSAIDS / acute blood loss anemia - Patient presented with black stools thought to be from ibuprofen he was taking for arthritis. - Pt has declined diagnostic studies  - No blood work other than on admission since family wanted to comfort care  Active Problems: Sepsis secondary to lobar pneumonia - Sepsis criteria met on 11/29/2014 - 11/30/2014 with fever, tachycardia, tachypnea. Lingular opacity seen on CXR 11/29/2014. - Pt initially started on zosyn. Antibiotics stopped 4/3 since family wants comfort care for patient  Thrombocytopenia - Unclear etiology. Platelet count 142. Stable - Used SCDs for DVT prophylaxis.  Arthritis - Was on morphine for pain control.   DVT Prophylaxis  - SCDs bilaterally in hospital.   Code Status: DNR/DNI   IV access:  Peripheral IV  Procedures and diagnostic studies:   Dg Chest Port 1v Same Day 11/29/2014 Lingular opacity, likely chronic scarring versus atelectasis.   Medical Consultants:  None   Other Consultants:  Physical therapy   IAnti-Infectives:   None     Time: December 12, 2014   2:00 am  Signed:  Leisa Lenz  [facility] 12-19-14, 9:25 PM

## 2014-12-29 DEATH — deceased

## 2016-11-07 IMAGING — DX DG CHEST 1V PORT SAME DAY
1 series · 1 of 1 positions shown · non-contrast
Comparison: None.

CLINICAL DATA: Congestion

EXAM:
PORTABLE CHEST - 1 VIEW SAME DAY

[chest ap]
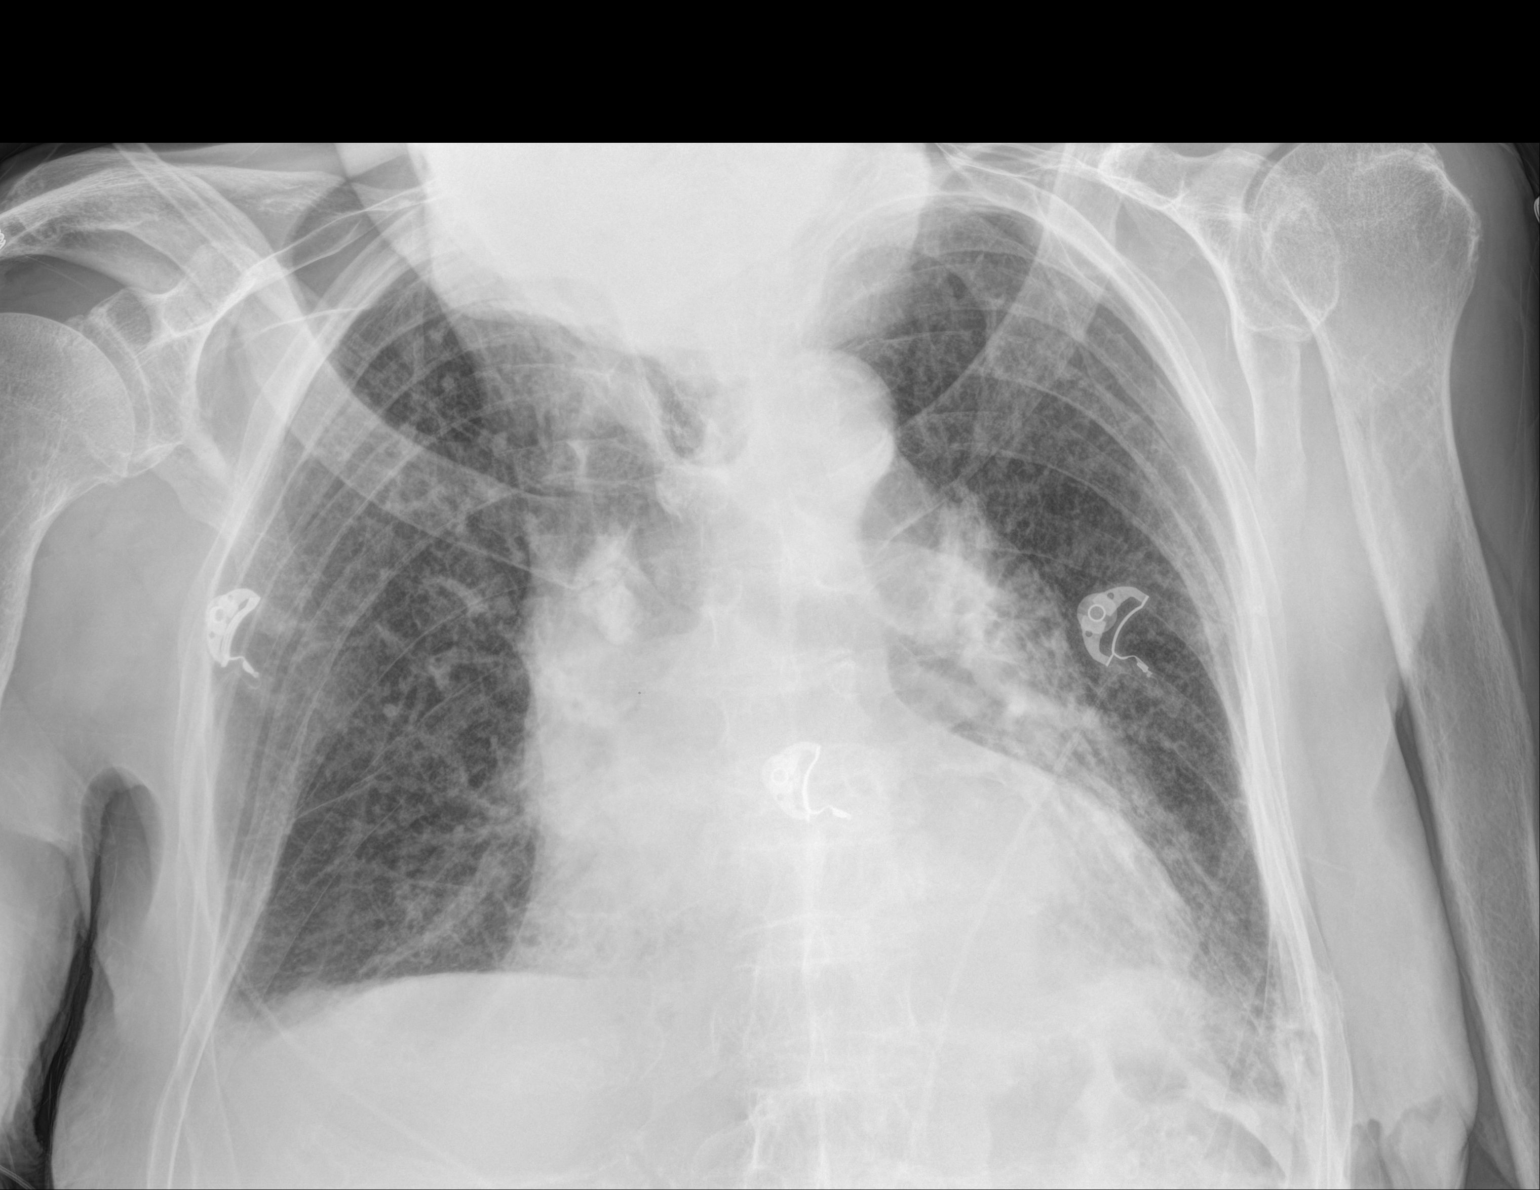

[1 of 1 positions shown; findings below may reference images not displayed]

FINDINGS: Lingular opacity, likely chronic scarring versus atelectasis. Mild
blunting of the right costophrenic angle. No pneumothorax.

Cardiomegaly.
IMPRESSION: Lingular opacity, likely chronic scarring versus atelectasis.
